# Patient Record
Sex: Male | Born: 1965 | Race: White | Hispanic: No | Marital: Married | Smoking: Former smoker
Health system: Southern US, Community
[De-identification: ages and names within clinical notes are randomized; demographics above are authoritative.]

## PROBLEM LIST (undated history)

## (undated) DIAGNOSIS — S0219XA Other fracture of base of skull, initial encounter for closed fracture: Secondary | ICD-10-CM

## (undated) DIAGNOSIS — R569 Unspecified convulsions: Secondary | ICD-10-CM

## (undated) DIAGNOSIS — N529 Male erectile dysfunction, unspecified: Secondary | ICD-10-CM

## (undated) DIAGNOSIS — M549 Dorsalgia, unspecified: Secondary | ICD-10-CM

## (undated) DIAGNOSIS — S069X9A Unspecified intracranial injury with loss of consciousness of unspecified duration, initial encounter: Secondary | ICD-10-CM

## (undated) DIAGNOSIS — R413 Other amnesia: Secondary | ICD-10-CM

## (undated) DIAGNOSIS — E291 Testicular hypofunction: Secondary | ICD-10-CM

## (undated) DIAGNOSIS — S069XAA Unspecified intracranial injury with loss of consciousness status unknown, initial encounter: Secondary | ICD-10-CM

## (undated) DIAGNOSIS — G4730 Sleep apnea, unspecified: Secondary | ICD-10-CM

## (undated) HISTORY — DX: Dorsalgia, unspecified: M54.9

## (undated) HISTORY — DX: Male erectile dysfunction, unspecified: N52.9

## (undated) HISTORY — DX: Unspecified intracranial injury with loss of consciousness status unknown, initial encounter: S06.9XAA

## (undated) HISTORY — PX: BRAIN SURGERY: SHX531

## (undated) HISTORY — DX: Testicular hypofunction: E29.1

## (undated) HISTORY — DX: Other fracture of base of skull, initial encounter for closed fracture: S02.19XA

## (undated) HISTORY — DX: Unspecified intracranial injury with loss of consciousness of unspecified duration, initial encounter: S06.9X9A

## (undated) HISTORY — DX: Unspecified convulsions: R56.9

## (undated) HISTORY — PX: FRACTURE SURGERY: SHX138

## (undated) HISTORY — PX: OTHER SURGICAL HISTORY: SHX169

---

## 2012-11-03 ENCOUNTER — Other Ambulatory Visit: Payer: Self-pay | Admitting: Family Medicine

## 2012-11-03 DIAGNOSIS — E785 Hyperlipidemia, unspecified: Secondary | ICD-10-CM

## 2012-11-21 ENCOUNTER — Other Ambulatory Visit: Payer: Self-pay | Admitting: Family Medicine

## 2012-11-21 DIAGNOSIS — R7989 Other specified abnormal findings of blood chemistry: Secondary | ICD-10-CM

## 2012-11-21 DIAGNOSIS — E559 Vitamin D deficiency, unspecified: Secondary | ICD-10-CM

## 2012-11-28 ENCOUNTER — Other Ambulatory Visit (INDEPENDENT_AMBULATORY_CARE_PROVIDER_SITE_OTHER): Payer: BC Managed Care – PPO

## 2012-11-28 DIAGNOSIS — E785 Hyperlipidemia, unspecified: Secondary | ICD-10-CM

## 2012-11-28 DIAGNOSIS — R7989 Other specified abnormal findings of blood chemistry: Secondary | ICD-10-CM

## 2012-11-28 DIAGNOSIS — E559 Vitamin D deficiency, unspecified: Secondary | ICD-10-CM

## 2012-11-28 LAB — CBC WITH DIFFERENTIAL/PLATELET
Eosinophils Relative: 3 % (ref 0–5)
HCT: 40.8 % (ref 39.0–52.0)
Hemoglobin: 14.6 g/dL (ref 13.0–17.0)
Lymphocytes Relative: 46 % (ref 12–46)
Lymphs Abs: 1.9 10*3/uL (ref 0.7–4.0)
MCV: 92.1 fL (ref 78.0–100.0)
Monocytes Absolute: 0.4 10*3/uL (ref 0.1–1.0)
Monocytes Relative: 9 % (ref 3–12)
Neutro Abs: 1.7 10*3/uL (ref 1.7–7.7)
WBC: 4.1 10*3/uL (ref 4.0–10.5)

## 2012-11-28 LAB — LIPID PANEL
HDL: 32 mg/dL — ABNORMAL LOW (ref >39)
LDL Cholesterol: 76 mg/dL (ref 0–99)
Total CHOL/HDL Ratio: 4.5 Ratio
Triglycerides: 179 mg/dL — ABNORMAL HIGH (ref <150)
VLDL: 36 mg/dL (ref 0–40)

## 2012-11-28 LAB — TESTOSTERONE: Testosterone: 262 ng/dL — ABNORMAL LOW (ref 300–890)

## 2012-11-28 LAB — BASIC METABOLIC PANEL
CO2: 31 mEq/L (ref 19–32)
Calcium: 8.7 mg/dL (ref 8.4–10.5)
Potassium: 4.4 mEq/L (ref 3.5–5.3)
Sodium: 144 mEq/L (ref 135–145)

## 2012-11-29 LAB — VITAMIN D 25 HYDROXY (VIT D DEFICIENCY, FRACTURES): Vit D, 25-Hydroxy: 39 ng/mL (ref 30–89)

## 2012-12-02 ENCOUNTER — Encounter: Payer: Self-pay | Admitting: Family Medicine

## 2012-12-02 ENCOUNTER — Ambulatory Visit (INDEPENDENT_AMBULATORY_CARE_PROVIDER_SITE_OTHER): Payer: BC Managed Care – PPO | Admitting: Family Medicine

## 2012-12-02 VITALS — BP 128/74 | HR 58 | Temp 97.9°F | Resp 14 | Ht 76.0 in | Wt 314.0 lb

## 2012-12-02 DIAGNOSIS — S0219XA Other fracture of base of skull, initial encounter for closed fracture: Secondary | ICD-10-CM | POA: Insufficient documentation

## 2012-12-02 DIAGNOSIS — E291 Testicular hypofunction: Secondary | ICD-10-CM | POA: Insufficient documentation

## 2012-12-02 DIAGNOSIS — S069X9A Unspecified intracranial injury with loss of consciousness of unspecified duration, initial encounter: Secondary | ICD-10-CM | POA: Insufficient documentation

## 2012-12-02 DIAGNOSIS — N529 Male erectile dysfunction, unspecified: Secondary | ICD-10-CM | POA: Insufficient documentation

## 2012-12-02 DIAGNOSIS — Z0000 Encounter for general adult medical examination without abnormal findings: Secondary | ICD-10-CM
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
## 2013-06-29 ENCOUNTER — Encounter: Payer: Self-pay | Admitting: Physician Assistant

## 2013-06-29 ENCOUNTER — Ambulatory Visit (INDEPENDENT_AMBULATORY_CARE_PROVIDER_SITE_OTHER): Payer: BC Managed Care – PPO | Admitting: Physician Assistant

## 2013-06-29 VITALS — BP 122/80 | HR 68 | Temp 98.4°F | Resp 20 | Wt 319.0 lb

## 2013-06-29 DIAGNOSIS — J988 Other specified respiratory disorders: Secondary | ICD-10-CM

## 2013-06-29 DIAGNOSIS — A499 Bacterial infection, unspecified: Secondary | ICD-10-CM

## 2013-06-29 DIAGNOSIS — B9689 Other specified bacterial agents as the cause of diseases classified elsewhere: Principal | ICD-10-CM

## 2013-06-29 MED ORDER — AZITHROMYCIN 250 MG PO TABS: ORAL_TABLET | ORAL | Status: DC

## 2013-06-29 NOTE — Progress Notes (Signed)
Patient ID: Richard Cardenas MRN: [id number], DOB: May 16, 1966, 48 y.o. Date of Encounter: 06/29/2013, 11:22 AM    Chief Complaint:  Chief Complaint  Patient presents with  . c/o cold, cough,congestion x 1 week    body aches     HPI: 48 y.o. year old white male reports that he has been sick for over one week. A lot of head and nasal congestion and achiness. Just in the last few days is also developing a cough. Has had no significant sore throat just irritation. His thermometer would not work he has not been able to check his temp. subjectively, does not think he has had any high fever. No ear ache.     Home Meds: See attached medication section for any medications that were entered at today's visit. The computer does not put those onto this list.The following list is a list of meds entered prior to today's visit.   Current Outpatient Prescriptions on File Prior to Visit  Medication Sig Dispense Refill  . COD LIVER OIL PO Take by mouth.      . Ginkgo Biloba 40 MG TABS Take by mouth.      . naproxen (NAPROSYN) 500 MG tablet Take 500 mg by mouth 2 (two) times daily with a meal.       No current facility-administered medications on file prior to visit.    Allergies:  Allergies  Allergen Reactions  . Ampicillin Rash      Review of Systems: See HPI for pertinent ROS. All other ROS negative.    Physical Exam: Blood pressure 122/80, pulse 68, temperature 98.4 F (36.9 C), temperature source Oral, resp. rate 20, weight 319 lb (144.697 kg)., Body mass index is 38.85 kg/(m^2). General:  WNWD WM. Appears in no acute distress. HEENT: Normocephalic, atraumatic, eyes without discharge, sclera non-icteric, nares are without discharge. He reports that he is blind in the left eye. Bilateral auditory canals clear, TM's are without perforation, pearly grey and translucent with reflective cone of light bilaterally. Oral cavity moist, posterior pharynx without exudate, erythema, peritonsillar  abscess. No tenderness with percussion of the sinuses.  Neck: Supple. No thyromegaly. No lymphadenopathy. Lungs: Clear bilaterally to auscultation without wheezes, rales, or rhonchi. Breathing is unlabored. Heart: Regular rhythm. No murmurs, rubs, or gallops. Msk:  Strength and tone normal for age. Extremities/Skin: Warm and dry. No clubbing or cyanosis. No edema. No rashes or suspicious lesions. Neuro: Alert and oriented X 3. Moves all extremities spontaneously. Gait is normal. CNII-XII grossly in tact. Psych: History of traumatic brain injury.      ASSESSMENT AND PLAN:  48 y.o. year old male with  1. Bacterial respiratory infection Allergy to ampicillin . - azithromycin (ZITHROMAX) 250 MG tablet; Day 1: Take 2 daily.  Days 2-5: Take 1 daily.  Dispense: 6 tablet; Refill: 0 Complete all of antibiotic. Continue over-the-counter medications for symptomatic relief as well if needed. Followup if symptoms do not resolve within 1 week of completing antibiotic.  Signed, [address] [place], [place], [facility] 06/29/2013 11:22 AM

## 2013-12-04 ENCOUNTER — Encounter: Payer: BC Managed Care – PPO | Admitting: Family Medicine

## 2014-10-31 ENCOUNTER — Encounter (HOSPITAL_COMMUNITY): Payer: Self-pay | Admitting: Emergency Medicine

## 2014-10-31 ENCOUNTER — Emergency Department (HOSPITAL_COMMUNITY): Payer: BLUE CROSS/BLUE SHIELD

## 2014-10-31 ENCOUNTER — Emergency Department (HOSPITAL_COMMUNITY)
Admission: EM | Admit: 2014-10-31 | Discharge: 2014-10-31 | Disposition: A | Discharge status: Home / Self Care | Payer: BLUE CROSS/BLUE SHIELD | Attending: Emergency Medicine | Admitting: Emergency Medicine

## 2014-10-31 DIAGNOSIS — Z79899 Other long term (current) drug therapy: Secondary | ICD-10-CM | POA: Insufficient documentation

## 2014-10-31 DIAGNOSIS — Z8781 Personal history of (healed) traumatic fracture: Secondary | ICD-10-CM | POA: Diagnosis not present

## 2014-10-31 DIAGNOSIS — R569 Unspecified convulsions: Secondary | ICD-10-CM

## 2014-10-31 DIAGNOSIS — Z8639 Personal history of other endocrine, nutritional and metabolic disease: Secondary | ICD-10-CM | POA: Diagnosis not present

## 2014-10-31 DIAGNOSIS — Z792 Long term (current) use of antibiotics: Secondary | ICD-10-CM | POA: Insufficient documentation

## 2014-10-31 DIAGNOSIS — R413 Other amnesia: Secondary | ICD-10-CM | POA: Diagnosis not present

## 2014-10-31 DIAGNOSIS — Z87438 Personal history of other diseases of male genital organs: Secondary | ICD-10-CM | POA: Insufficient documentation

## 2014-10-31 DIAGNOSIS — Z791 Long term (current) use of non-steroidal anti-inflammatories (NSAID): Secondary | ICD-10-CM | POA: Insufficient documentation

## 2014-10-31 DIAGNOSIS — Z87820 Personal history of traumatic brain injury: Secondary | ICD-10-CM | POA: Diagnosis not present

## 2014-10-31 LAB — CBC WITH DIFFERENTIAL/PLATELET
Basophils Absolute: 0 10*3/uL (ref 0.0–0.1)
Basophils Relative: 0 % (ref 0–1)
EOS PCT: 1 % (ref 0–5)
Eosinophils Absolute: 0 10*3/uL (ref 0.0–0.7)
HEMATOCRIT: 41.5 % (ref 39.0–52.0)
Hemoglobin: 15 g/dL (ref 13.0–17.0)
LYMPHS ABS: 1 10*3/uL (ref 0.7–4.0)
LYMPHS PCT: 16 % (ref 12–46)
MCH: 32.6 pg (ref 26.0–34.0)
MCHC: 36.1 g/dL — ABNORMAL HIGH (ref 30.0–36.0)
MCV: 90.2 fL (ref 78.0–100.0)
MONO ABS: 0.4 10*3/uL (ref 0.1–1.0)
MONOS PCT: 7 % (ref 3–12)
Neutro Abs: 4.6 10*3/uL (ref 1.7–7.7)
Neutrophils Relative %: 76 % (ref 43–77)
PLATELETS: 176 10*3/uL (ref 150–400)
RBC: 4.6 MIL/uL (ref 4.22–5.81)
RDW: 11.9 % (ref 11.5–15.5)
WBC: 6.1 10*3/uL (ref 4.0–10.5)

## 2014-10-31 LAB — MAGNESIUM: Magnesium: 2.2 mg/dL (ref 1.7–2.4)

## 2014-10-31 LAB — URINALYSIS, ROUTINE W REFLEX MICROSCOPIC
Bilirubin Urine: NEGATIVE
Glucose, UA: NEGATIVE mg/dL
Hgb urine dipstick: NEGATIVE
Ketones, ur: NEGATIVE mg/dL
LEUKOCYTES UA: NEGATIVE
NITRITE: NEGATIVE
Protein, ur: NEGATIVE mg/dL
SPECIFIC GRAVITY, URINE: 1.024 (ref 1.005–1.030)
UROBILINOGEN UA: 0.2 mg/dL (ref 0.0–1.0)
pH: 5.5 (ref 5.0–8.0)

## 2014-10-31 LAB — BASIC METABOLIC PANEL
Anion gap: 10 (ref 5–15)
BUN: 22 mg/dL — ABNORMAL HIGH (ref 6–20)
CALCIUM: 8.8 mg/dL — AB (ref 8.9–10.3)
CO2: 24 mmol/L (ref 22–32)
CREATININE: 0.93 mg/dL (ref 0.61–1.24)
Chloride: 102 mmol/L (ref 101–111)
GFR calc Af Amer: >60 mL/min (ref >60)
Glucose, Bld: 115 mg/dL — ABNORMAL HIGH (ref 70–99)
Potassium: 3.8 mmol/L (ref 3.5–5.1)
Sodium: 136 mmol/L (ref 135–145)

## 2014-10-31 LAB — PHOSPHORUS: Phosphorus: 2.1 mg/dL — ABNORMAL LOW (ref 2.5–4.6)

## 2014-10-31 MED ORDER — SODIUM CHLORIDE 0.9 % IV SOLN
1000.0000 mg | Freq: Once | INTRAVENOUS | Status: AC
  Administered 2014-10-31: 1000 mg via INTRAVENOUS
  Filled 2014-10-31: qty 10

## 2014-10-31 MED ORDER — LEVETIRACETAM 500 MG PO TABS: 500.0000 mg | ORAL_TABLET | Freq: Two times a day (BID) | ORAL | Status: DC

## 2014-10-31 NOTE — ED Notes (Signed)
Neurology at bedside.

## 2014-10-31 NOTE — ED Notes (Signed)
EDP at bedside  
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
## 2014-10-31 NOTE — Discharge Instructions (Signed)

## 2014-10-31 NOTE — ED Notes (Signed)
Pt was unable to provide sample at this time
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
## 2014-11-04 ENCOUNTER — Encounter: Payer: Self-pay | Admitting: Neurology

## 2014-11-04 ENCOUNTER — Ambulatory Visit (INDEPENDENT_AMBULATORY_CARE_PROVIDER_SITE_OTHER): Payer: BLUE CROSS/BLUE SHIELD | Admitting: Neurology

## 2014-11-04 VITALS — BP 146/91 | HR 68 | Ht 76.0 in | Wt 322.0 lb

## 2014-11-04 DIAGNOSIS — R569 Unspecified convulsions: Secondary | ICD-10-CM | POA: Diagnosis not present

## 2014-11-04 MED ORDER — LEVETIRACETAM 1000 MG PO TABS: 1000.0000 mg | ORAL_TABLET | Freq: Two times a day (BID) | ORAL | Status: DC

## 2014-11-04 NOTE — Patient Instructions (Signed)
No drive till seizure free for 6 months, no mechanical operations
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
## 2014-11-08 ENCOUNTER — Other Ambulatory Visit: Payer: BLUE CROSS/BLUE SHIELD

## 2014-11-11 ENCOUNTER — Ambulatory Visit (INDEPENDENT_AMBULATORY_CARE_PROVIDER_SITE_OTHER): Payer: BLUE CROSS/BLUE SHIELD | Admitting: *Deleted

## 2014-11-11 DIAGNOSIS — Z23 Encounter for immunization: Secondary | ICD-10-CM | POA: Diagnosis not present

## 2014-11-11 NOTE — Progress Notes (Signed)
Patient ID: Richard Cardenas, male   DOB: 04/28/1966, 49 y.o.   MRN: [id number] Patient seen in office for Tdap Vaccination.   Tolerated IM administration well.   Immunization history updated.

## 2014-11-23 ENCOUNTER — Ambulatory Visit
Admission: RE | Admit: 2014-11-23 | Discharge: 2014-11-23 | Disposition: A | Discharge status: Home / Self Care | Payer: BLUE CROSS/BLUE SHIELD | Source: Ambulatory Visit | Attending: Neurology | Admitting: Neurology

## 2014-11-23 DIAGNOSIS — R569 Unspecified convulsions: Secondary | ICD-10-CM | POA: Diagnosis not present

## 2014-11-23 MED ORDER — GADOBENATE DIMEGLUMINE 529 MG/ML IV SOLN
20.0000 mL | Freq: Once | INTRAVENOUS | Status: AC | PRN
  Administered 2014-11-23: 20 mL via INTRAVENOUS

## 2014-11-25 ENCOUNTER — Telehealth: Payer: Self-pay | Admitting: Neurology

## 2014-11-25 ENCOUNTER — Ambulatory Visit (INDEPENDENT_AMBULATORY_CARE_PROVIDER_SITE_OTHER): Payer: BLUE CROSS/BLUE SHIELD | Admitting: Neurology

## 2014-11-25 DIAGNOSIS — R569 Unspecified convulsions: Secondary | ICD-10-CM | POA: Diagnosis not present

## 2014-11-25 NOTE — Telephone Encounter (Signed)
Richard Cardenas Please call patient, MRI of the brain showed scars at left side of the brain, and the previous left side brain surgery. Consistent with history medical brain injury.   Abnormal MRI brain (with and without) demonstrating: 1. Encephalomalacia and gliosis in the left frontal, left temporal, left parietal regions. Left fronto-temporal craniotomy and cranioplasty. Consistent with prior traumatic brain injury. 2. No acute findings.

## 2014-11-25 NOTE — Telephone Encounter (Signed)
Patient aware of results.

## 2014-11-26 NOTE — Procedures (Signed)
   HISTORY: 49 yo male with history of traumatic brain injury, left frontotemporal craniotomy, presented with seizure.  TECHNIQUE:  16 channel EEG was performed based on standard 10-16 international system. One channel was dedicated to EKG, which has demonstrates normal sinus rhythm of 60 beats per minutes.  Upon awakening, the posterior background activity was mildly dysarrythmic, right side is in mixed apha and theta range, left side has higher amplitude, within theta range, especially at F7, T3, T5 leads,  reactive to eye opening and closure.  There was occasionally F7 sharp transient  Photic stimulation was performed, which induced a symmetric photic driving.  Hyperventilation was performed, there was continued evidence of left side mild slowing dysarrythmic activities.  No sleep was achieved.  CONCLUSION: This is an abnormal EEG.  There is  electrodiagnostic evidence of left frontotemporal slowing, irritability, consistent with history of brain injury. He is at increased risk of partial seizure.

## 2014-11-30 ENCOUNTER — Encounter: Payer: Self-pay | Admitting: Neurology

## 2014-11-30 NOTE — Telephone Encounter (Signed)
He is calling for EEG results.

## 2014-12-02 ENCOUNTER — Telehealth: Payer: Self-pay | Admitting: Neurology

## 2014-12-02 NOTE — Telephone Encounter (Signed)
Spoke to patient's wife, Deliah Goody (on HIPPA) - she is aware of the results - they will keep the follow up appt on 12/08/14 to further discuss.

## 2014-12-02 NOTE — Telephone Encounter (Signed)
Richard Cardenas, please call patient  This is an abnormal EEG. There is electrodiagnostic evidence of left frontotemporal slowing, irritability, consistent with history of brain injury. He is at increased risk of partial seizure.

## 2014-12-08 ENCOUNTER — Encounter: Payer: Self-pay | Admitting: Neurology

## 2014-12-08 ENCOUNTER — Ambulatory Visit (INDEPENDENT_AMBULATORY_CARE_PROVIDER_SITE_OTHER): Payer: BLUE CROSS/BLUE SHIELD | Admitting: Neurology

## 2014-12-08 VITALS — BP 147/92 | HR 78 | Ht 76.0 in | Wt 322.0 lb

## 2014-12-08 DIAGNOSIS — S069X5S Unspecified intracranial injury with loss of consciousness greater than 24 hours with return to pre-existing conscious level, sequela: Secondary | ICD-10-CM

## 2014-12-08 DIAGNOSIS — R569 Unspecified convulsions: Secondary | ICD-10-CM
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
## 2014-12-21 ENCOUNTER — Telehealth: Payer: Self-pay | Admitting: Family Medicine

## 2014-12-21 NOTE — Telephone Encounter (Signed)
Patient calling to discuss a note for jury duty  Please call him at [phone]

## 2014-12-21 NOTE — Telephone Encounter (Signed)
Pt has not been seen since 2014 and he is aware that you are out of town until Tuesday - Madaline Savage Duty is not until August. Pt request we call his wife at [phone].

## 2014-12-28 ENCOUNTER — Encounter: Payer: Self-pay | Admitting: Family Medicine

## 2014-12-28 NOTE — Telephone Encounter (Signed)
On my desk

## 2014-12-28 NOTE — Telephone Encounter (Signed)
pts wife aware letter is ready and will pick it up - letter placed up front

## 2015-04-04 ENCOUNTER — Encounter: Payer: Self-pay | Admitting: Family Medicine

## 2015-04-07 ENCOUNTER — Other Ambulatory Visit: Payer: Self-pay | Admitting: Family Medicine

## 2015-04-07 ENCOUNTER — Other Ambulatory Visit: Payer: BLUE CROSS/BLUE SHIELD

## 2015-04-07 ENCOUNTER — Ambulatory Visit: Payer: BLUE CROSS/BLUE SHIELD

## 2015-04-07 DIAGNOSIS — Z139 Encounter for screening, unspecified: Secondary | ICD-10-CM

## 2015-04-08 ENCOUNTER — Encounter: Payer: Self-pay | Admitting: Family Medicine

## 2015-04-08 LAB — HEPATITIS B CORE ANTIBODY, IGM: Hep B C IgM: NONREACTIVE

## 2015-04-08 LAB — HEPATITIS C ANTIBODY: HCV Ab: NEGATIVE

## 2015-06-07 ENCOUNTER — Ambulatory Visit (INDEPENDENT_AMBULATORY_CARE_PROVIDER_SITE_OTHER): Payer: BLUE CROSS/BLUE SHIELD | Admitting: Nurse Practitioner

## 2015-06-07 ENCOUNTER — Encounter: Payer: Self-pay | Admitting: Nurse Practitioner

## 2015-06-07 VITALS — BP 132/89 | HR 58 | Ht 76.0 in | Wt 340.4 lb

## 2015-06-07 DIAGNOSIS — G40909 Epilepsy, unspecified, not intractable, without status epilepticus: Secondary | ICD-10-CM | POA: Insufficient documentation

## 2015-06-07 DIAGNOSIS — Z87820 Personal history of traumatic brain injury: Secondary | ICD-10-CM | POA: Diagnosis not present

## 2015-06-07 NOTE — Progress Notes (Signed)
I have reviewed and agreed above plan. 

## 2015-06-07 NOTE — Patient Instructions (Signed)
Continue Keppra at current dose Call for seizure activity May resume driving F/U in 6 months
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
## 2015-06-10 ENCOUNTER — Encounter: Payer: Self-pay | Admitting: Nurse Practitioner

## 2015-06-10 ENCOUNTER — Encounter: Payer: Self-pay | Admitting: *Deleted

## 2015-07-15 ENCOUNTER — Telehealth: Payer: Self-pay | Admitting: Family Medicine
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
## 2015-07-19 ENCOUNTER — Other Ambulatory Visit: Payer: Self-pay | Admitting: Specialist

## 2015-07-19 DIAGNOSIS — M545 Low back pain: Secondary | ICD-10-CM

## 2015-07-21 ENCOUNTER — Ambulatory Visit
Admission: RE | Admit: 2015-07-21 | Discharge: 2015-07-21 | Disposition: A | Discharge status: Home / Self Care | Payer: Worker's Compensation | Source: Ambulatory Visit | Attending: Specialist | Admitting: Specialist

## 2015-07-21 DIAGNOSIS — M545 Low back pain: Secondary | ICD-10-CM

## 2015-07-21 NOTE — Telephone Encounter (Signed)
I called and spoke with Richard Cardenas at [facility] she is going to resubmit with the code below.

## 2015-10-29 ENCOUNTER — Other Ambulatory Visit: Payer: Self-pay | Admitting: Neurology

## 2015-10-31 ENCOUNTER — Telehealth: Payer: Self-pay | Admitting: Neurology

## 2015-10-31 NOTE — Telephone Encounter (Signed)
Pt's wife called in requesting a refill on levETIRAcetam (KEPPRA) 1000 MG tablet. Next appt is 11/26/15. Pt will be out of medication today.  [facility] [place],  - [address]

## 2015-10-31 NOTE — Telephone Encounter (Signed)
Rx sent to the pharmacy.

## 2015-11-03 ENCOUNTER — Ambulatory Visit: Payer: Self-pay | Admitting: Podiatry

## 2015-11-16 ENCOUNTER — Encounter: Payer: Self-pay | Admitting: Podiatry

## 2015-11-16 ENCOUNTER — Ambulatory Visit (INDEPENDENT_AMBULATORY_CARE_PROVIDER_SITE_OTHER): Payer: BLUE CROSS/BLUE SHIELD

## 2015-11-16 ENCOUNTER — Ambulatory Visit (INDEPENDENT_AMBULATORY_CARE_PROVIDER_SITE_OTHER): Payer: BLUE CROSS/BLUE SHIELD | Admitting: Podiatry

## 2015-11-16 VITALS — BP 138/83 | HR 72 | Resp 16 | Ht 76.0 in | Wt 340.0 lb

## 2015-11-16 DIAGNOSIS — M79672 Pain in left foot: Secondary | ICD-10-CM | POA: Diagnosis not present

## 2015-11-16 DIAGNOSIS — M722 Plantar fascial fibromatosis: Secondary | ICD-10-CM

## 2015-11-16 DIAGNOSIS — M79671 Pain in right foot: Secondary | ICD-10-CM

## 2015-11-16 MED ORDER — TRIAMCINOLONE ACETONIDE 10 MG/ML IJ SUSP
10.0000 mg | Freq: Once | INTRAMUSCULAR | Status: AC
  Administered 2015-11-16: 10 mg

## 2015-11-16 NOTE — Patient Instructions (Signed)
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
## 2015-11-16 NOTE — Progress Notes (Signed)
Subjective:    Patient ID: Richard Cardenas, male    DOB: [birth year], 50 y.o.   MRN: [id number]  HPI Chief Complaint  Patient presents with  . Foot Pain    Bilateral; on Right foot; plantar forefoot & arch; on Left foot; heel; x6-8 months      Review of Systems  Musculoskeletal: Positive for myalgias.  Neurological: Positive for seizures.  All other systems reviewed and are negative.      Objective:   Physical Exam        Assessment & Plan:

## 2015-12-06 ENCOUNTER — Ambulatory Visit: Payer: BLUE CROSS/BLUE SHIELD | Admitting: Nurse Practitioner

## 2015-12-14 ENCOUNTER — Encounter: Payer: Self-pay | Admitting: Nurse Practitioner

## 2015-12-14 ENCOUNTER — Ambulatory Visit (INDEPENDENT_AMBULATORY_CARE_PROVIDER_SITE_OTHER): Payer: BLUE CROSS/BLUE SHIELD | Admitting: Nurse Practitioner

## 2015-12-14 VITALS — BP 124/73 | HR 72 | Ht 76.0 in | Wt 339.4 lb

## 2015-12-14 DIAGNOSIS — G40909 Epilepsy, unspecified, not intractable, without status epilepticus: Secondary | ICD-10-CM

## 2015-12-14 DIAGNOSIS — Z87820 Personal history of traumatic brain injury: Secondary | ICD-10-CM | POA: Diagnosis not present

## 2015-12-14 MED ORDER — LEVETIRACETAM 1000 MG PO TABS: 1000.0000 mg | ORAL_TABLET | Freq: Two times a day (BID) | ORAL | Status: DC
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
## 2015-12-14 NOTE — Patient Instructions (Signed)
Continue Keppra at current dose will refill  Call for seizure activity F/U in 1 year

## 2015-12-15 NOTE — Progress Notes (Signed)
I have reviewed and agreed above plan. 

## 2016-01-04 ENCOUNTER — Ambulatory Visit: Payer: BLUE CROSS/BLUE SHIELD | Admitting: Podiatry

## 2016-02-24 ENCOUNTER — Encounter: Payer: Self-pay | Admitting: Podiatry

## 2016-03-07 ENCOUNTER — Encounter: Payer: Self-pay | Admitting: Podiatry

## 2016-03-07 ENCOUNTER — Ambulatory Visit (INDEPENDENT_AMBULATORY_CARE_PROVIDER_SITE_OTHER): Payer: BLUE CROSS/BLUE SHIELD | Admitting: Podiatry

## 2016-03-07 DIAGNOSIS — M722 Plantar fascial fibromatosis: Secondary | ICD-10-CM

## 2016-03-07 NOTE — Patient Instructions (Signed)

## 2016-03-07 NOTE — Progress Notes (Signed)
Subjective:     Patient ID: Richard Cardenas, male   DOB: [birth year], 50 y.o.   MRN: [id number]  HPI patient states the left heel remained sore in the medial band and the right one seems to be some better and they have tried stretching but have not done a lot of ice   Review of Systems     Objective:   Physical Exam Neurovascular status intact with patient found to have inflammation in the plantar heel left still present medial band with exquisite discomfort when palpated    Assessment:     Plantar fasciitis left heel    Plan:     Reinjected the plantar fascia 3 Milligan Kenalog 5 mill grams Xylocaine discussed physical therapy dispensed night splint with instructions on usage start orthotics and reappoint to recheck

## 2016-04-04 ENCOUNTER — Encounter: Payer: Self-pay | Admitting: Podiatry

## 2016-04-04 ENCOUNTER — Ambulatory Visit (INDEPENDENT_AMBULATORY_CARE_PROVIDER_SITE_OTHER): Payer: BLUE CROSS/BLUE SHIELD | Admitting: Podiatry

## 2016-04-04 DIAGNOSIS — M722 Plantar fascial fibromatosis: Secondary | ICD-10-CM | POA: Diagnosis not present

## 2016-04-04 MED ORDER — MELOXICAM 15 MG PO TABS: 15.0000 mg | ORAL_TABLET | Freq: Every day | ORAL | 2 refills | Status: DC

## 2016-04-04 MED ORDER — TRIAMCINOLONE ACETONIDE 10 MG/ML IJ SUSP
10.0000 mg | Freq: Once | INTRAMUSCULAR | Status: AC
  Administered 2016-04-04: 10 mg

## 2016-04-04 NOTE — Progress Notes (Signed)
Subjective:     Patient ID: Richard Cardenas, male   DOB: [birth year], 50 y.o.   MRN: [id number]  HPI patient states my heel is improving but it's still sore when I do deep walking or standing   Review of Systems     Objective:   Physical Exam Neurovascular status intact with continued discomfort left plantar fashion with improvement but pain still present upon palpation    Assessment:     Fasciitis left still present but improved    Plan:     Final injection administered 3 Milligan Kenalog 5 mill grams Xylocaine discussed importance of night splint orthotics and supportive shoe gear

## 2016-05-31 ENCOUNTER — Encounter: Payer: Self-pay | Admitting: Physician Assistant

## 2016-05-31 ENCOUNTER — Ambulatory Visit (INDEPENDENT_AMBULATORY_CARE_PROVIDER_SITE_OTHER): Payer: BLUE CROSS/BLUE SHIELD | Admitting: Physician Assistant

## 2016-05-31 VITALS — BP 122/74 | HR 66 | Temp 98.0°F | Resp 18 | Wt 344.0 lb

## 2016-05-31 DIAGNOSIS — J988 Other specified respiratory disorders: Secondary | ICD-10-CM

## 2016-05-31 DIAGNOSIS — B9689 Other specified bacterial agents as the cause of diseases classified elsewhere: Principal | ICD-10-CM

## 2016-05-31 MED ORDER — AZITHROMYCIN 250 MG PO TABS: ORAL_TABLET | ORAL | 0 refills | Status: DC

## 2016-05-31 NOTE — Progress Notes (Signed)
Patient ID: Richard Cardenas MRN: [id number], DOB: 09-09-1965, Richard y.o. Date of Encounter: 05/31/2016, 10:51 AM    Chief Complaint:  Chief Complaint  Patient presents with  . nasal congested  . Headache     HPI: Richard y.o. year old male reasons with above. Says he's been having a lot of congestion in his head and nose as well as his chest. Is coughing up some phlegm at times. Says that his wife has the same type of symptoms and recently had to get antibiotic. He has had no sore throat or ear ache and no fevers or chills.     Home Meds:   Outpatient Medications Prior to Visit  Medication Sig Dispense Refill  . aspirin-acetaminophen-caffeine (EXCEDRIN MIGRAINE) 250-250-65 MG tablet Take by mouth every 6 (six) hours as needed for headache.    . cetirizine (ZYRTEC) 10 MG tablet Take 10 mg by mouth daily.    . cyclobenzaprine (FLEXERIL) 10 MG tablet Take 10 mg by mouth as needed for muscle spasms.    . hydroxypropyl methylcellulose / hypromellose (ISOPTO TEARS / GONIOVISC) 2.5 % ophthalmic solution Place 1 drop into both eyes 2 (two) times daily.    Marland Kitchen levETIRAcetam (KEPPRA) 1000 MG tablet Take 1 tablet (1,000 mg total) by mouth 2 (two) times daily. 180 tablet 3  . meloxicam (MOBIC) 15 MG tablet Take 1 tablet (15 mg total) by mouth daily. 30 tablet 2  . Multiple Vitamin (MULTIVITAMIN) tablet Take 1 tablet by mouth daily.    . Pyridoxine HCl (VITAMIN B-6) 500 MG tablet Take 500 mg by mouth daily.    . vitamin C (ASCORBIC ACID) 500 MG tablet Take 500 mg by mouth daily.     No facility-administered medications prior to visit.     Allergies:  Allergies  Allergen Reactions  . Ampicillin Rash      Review of Systems: See HPI for pertinent ROS. All other ROS negative.    Physical Exam: Blood pressure 122/74, pulse 66, temperature 98 F (36.7 C), temperature source Oral, resp. rate 18, weight (!) 344 lb (156 kg), SpO2 98 %., Body mass index is 41.87 kg/m. General:  WM. Appears in no  acute distress. HEENT: Normocephalic, atraumatic, eyes without discharge, sclera non-icteric, nares are without discharge. Bilateral auditory canals clear, TM's are without perforation, pearly grey and translucent with reflective cone of light bilaterally. Oral cavity moist, posterior pharynx without exudate, erythema, peritonsillar abscess. No tenderness with percussion to frontal or maxillary sinuses bilaterally.  Neck: Supple. No thyromegaly. No lymphadenopathy. Lungs: Clear bilaterally to auscultation without wheezes, rales, or rhonchi. Breathing is unlabored. Heart: Regular rhythm. No murmurs, rubs, or gallops. Msk:  Strength and tone normal for age. Extremities/Skin: Warm and dry.  Neuro: Alert and oriented X 3. Moves all extremities spontaneously. Gait is normal. CNII-XII grossly in tact. Psych:  Responds to questions appropriately with a normal affect.     ASSESSMENT AND PLAN:  Richard y.o. year old male with  1. Bacterial respiratory infection Has ampicillin allergy documented. Therefore will use azithromycin. To take antibiotic as directed. Follow-up if symptoms do not resolve within 1 week after completion of antibiotic. - azithromycin (ZITHROMAX) 250 MG tablet; Day 1: Take 2 daily. Days 2-5: Take 1 daily.  Dispense: 6 tablet; Refill: 0   Signed, [address] [place], [place], [facility] 05/31/2016 10:51 AM

## 2016-06-27 ENCOUNTER — Telehealth: Payer: Self-pay | Admitting: Family Medicine

## 2016-06-27 MED ORDER — LEVOFLOXACIN 750 MG PO TABS: 750.0000 mg | ORAL_TABLET | Freq: Every day | ORAL | 0 refills | Status: DC

## 2016-06-27 NOTE — Telephone Encounter (Signed)
Still sick from December.  Off work today.  Sneezing.  Cough,congestion.  OTC's not helping.  Works outside a lot.  No fever.  What else can we do?

## 2016-06-27 NOTE — Telephone Encounter (Signed)
Ampicillin allergy.  At OV prescribed azithromycin. At this time--- prescribe Levaquin 750 mg 1 by mouth daily times #7 dispense #7+0.

## 2016-06-27 NOTE — Telephone Encounter (Signed)
Pt made aware of Rx.  If still not better after this will NTBS

## 2016-07-03 ENCOUNTER — Other Ambulatory Visit: Payer: Self-pay | Admitting: Physician Assistant

## 2016-07-26 ENCOUNTER — Other Ambulatory Visit: Payer: Self-pay | Admitting: Podiatry

## 2016-07-26 NOTE — Telephone Encounter (Signed)
Pt needs an appt prior to future refills. 

## 2016-08-16 ENCOUNTER — Other Ambulatory Visit: Payer: Self-pay | Admitting: Family Medicine

## 2016-08-16 DIAGNOSIS — E291 Testicular hypofunction: Secondary | ICD-10-CM

## 2016-08-16 DIAGNOSIS — Z0000 Encounter for general adult medical examination without abnormal findings: Secondary | ICD-10-CM

## 2016-08-16 DIAGNOSIS — G40909 Epilepsy, unspecified, not intractable, without status epilepticus: Secondary | ICD-10-CM

## 2016-08-16 DIAGNOSIS — Z79899 Other long term (current) drug therapy: Secondary | ICD-10-CM

## 2016-08-20 ENCOUNTER — Other Ambulatory Visit: Payer: BLUE CROSS/BLUE SHIELD

## 2016-08-20 DIAGNOSIS — G40909 Epilepsy, unspecified, not intractable, without status epilepticus: Secondary | ICD-10-CM

## 2016-08-20 DIAGNOSIS — Z79899 Other long term (current) drug therapy: Secondary | ICD-10-CM

## 2016-08-20 DIAGNOSIS — E291 Testicular hypofunction: Secondary | ICD-10-CM

## 2016-08-20 DIAGNOSIS — Z0000 Encounter for general adult medical examination without abnormal findings: Secondary | ICD-10-CM

## 2016-08-20 LAB — PSA: PSA: 0.5 ng/mL (ref <=4.0)

## 2016-08-20 LAB — COMPLETE METABOLIC PANEL WITH GFR
ALK PHOS: 28 U/L — AB (ref 40–115)
ALT: 18 U/L (ref 9–46)
AST: 19 U/L (ref 10–35)
Albumin: 3.8 g/dL (ref 3.6–5.1)
BUN: 18 mg/dL (ref 7–25)
CO2: 30 mmol/L (ref 20–31)
Calcium: 8.9 mg/dL (ref 8.6–10.3)
Chloride: 107 mmol/L (ref 98–110)
Creat: 0.93 mg/dL (ref 0.70–1.33)
GFR, Est African American: >89 mL/min (ref >=60)
GFR, Est Non African American: >89 mL/min (ref >=60)
GLUCOSE: 91 mg/dL (ref 70–99)
Potassium: 4.6 mmol/L (ref 3.5–5.3)
SODIUM: 143 mmol/L (ref 135–146)
TOTAL PROTEIN: 6.2 g/dL (ref 6.1–8.1)
Total Bilirubin: 0.5 mg/dL (ref 0.2–1.2)

## 2016-08-20 LAB — CBC WITH DIFFERENTIAL/PLATELET
BASOS PCT: 1 %
Basophils Absolute: 44 cells/uL (ref 0–200)
EOS ABS: 88 {cells}/uL (ref 15–500)
Eosinophils Relative: 2 %
HEMATOCRIT: 41.9 % (ref 38.5–50.0)
HEMOGLOBIN: 14.4 g/dL (ref 13.0–17.0)
LYMPHS ABS: 1804 {cells}/uL (ref 850–3900)
LYMPHS PCT: 41 %
MCH: 32.7 pg (ref 27.0–33.0)
MCHC: 34.4 g/dL (ref 32.0–36.0)
MCV: 95.2 fL (ref 80.0–100.0)
MONO ABS: 352 {cells}/uL (ref 200–950)
MPV: 10.5 fL (ref 7.5–12.5)
Monocytes Relative: 8 %
Neutro Abs: 2112 cells/uL (ref 1500–7800)
Neutrophils Relative %: 48 %
Platelets: 176 10*3/uL (ref 140–400)
RBC: 4.4 MIL/uL (ref 4.20–5.80)
RDW: 13.3 % (ref 11.0–15.0)
WBC: 4.4 10*3/uL (ref 3.8–10.8)

## 2016-08-20 LAB — LIPID PANEL
Cholesterol: 144 mg/dL (ref <200)
HDL: 30 mg/dL — AB (ref >40)
LDL CALC: 70 mg/dL (ref <100)
Total CHOL/HDL Ratio: 4.8 Ratio (ref <5.0)
Triglycerides: 220 mg/dL — ABNORMAL HIGH (ref <150)
VLDL: 44 mg/dL — AB (ref <30)

## 2016-08-20 LAB — TSH: TSH: 1.32 mIU/L (ref 0.40–4.50)

## 2016-08-23 ENCOUNTER — Encounter: Payer: Self-pay | Admitting: Physician Assistant

## 2016-08-23 ENCOUNTER — Ambulatory Visit (INDEPENDENT_AMBULATORY_CARE_PROVIDER_SITE_OTHER): Payer: BLUE CROSS/BLUE SHIELD | Admitting: Physician Assistant

## 2016-08-23 VITALS — BP 118/68 | HR 68 | Temp 97.8°F | Resp 18 | Wt 345.6 lb

## 2016-08-23 DIAGNOSIS — Z0000 Encounter for general adult medical examination without abnormal findings: Secondary | ICD-10-CM
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
## 2016-08-28 ENCOUNTER — Other Ambulatory Visit: Payer: Self-pay | Admitting: Podiatry

## 2016-09-18 ENCOUNTER — Telehealth: Payer: Self-pay | Admitting: Family Medicine

## 2016-09-18 NOTE — Telephone Encounter (Signed)
patient dropped off a handicap placard reqeust. I have put in Sandy's folder.

## 2016-09-20 NOTE — Telephone Encounter (Signed)
Contact phone number incorrect in this note. Called number listed in demo and pt aware to pu completed form.

## 2016-09-25 ENCOUNTER — Encounter: Payer: Self-pay | Admitting: Physician Assistant

## 2016-10-01 ENCOUNTER — Encounter: Payer: Self-pay | Admitting: Family Medicine
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
## 2016-11-19 LAB — HM COLONOSCOPY

## 2016-12-19 ENCOUNTER — Ambulatory Visit (INDEPENDENT_AMBULATORY_CARE_PROVIDER_SITE_OTHER): Payer: BLUE CROSS/BLUE SHIELD | Admitting: Nurse Practitioner

## 2016-12-19 ENCOUNTER — Encounter: Payer: Self-pay | Admitting: Physician Assistant

## 2016-12-19 ENCOUNTER — Encounter: Payer: Self-pay | Admitting: Nurse Practitioner

## 2016-12-19 VITALS — BP 129/82 | HR 70 | Ht 76.0 in | Wt 345.8 lb

## 2016-12-19 DIAGNOSIS — Z87820 Personal history of traumatic brain injury: Secondary | ICD-10-CM

## 2016-12-19 DIAGNOSIS — G40909 Epilepsy, unspecified, not intractable, without status epilepticus: Secondary | ICD-10-CM | POA: Diagnosis not present

## 2016-12-19 MED ORDER — LEVETIRACETAM 1000 MG PO TABS: 1000.0000 mg | ORAL_TABLET | Freq: Two times a day (BID) | ORAL | 3 refills | Status: DC
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
## 2016-12-19 NOTE — Patient Instructions (Addendum)
Continue Keppra at current dose will refill  Call for seizure activity Encouraged to increase fluid intake since he works outside and is prone to get dehydrated F/U in 1 year  Dehydration, Adult Dehydration is when there is not enough fluid or water in your body. This happens when you lose more fluids than you take in. Dehydration can range from mild to very bad. It should be treated right away to keep it from getting very bad. Symptoms of mild dehydration may include:  Thirst.  Dry lips.  Slightly dry mouth.  Dry, warm skin.  Dizziness. Symptoms of moderate dehydration may include:  Very dry mouth.  Muscle cramps.  Dark pee (urine). Pee may be the color of tea.  Your body making less pee.  Your eyes making fewer tears.  Heartbeat that is uneven or faster than normal (palpitations).  Headache.  Light-headedness, especially when you stand up from sitting.  Fainting (syncope). Symptoms of very bad dehydration may include:  Changes in skin, such as: ? Cold and clammy skin. ? Blotchy (mottled) or pale skin. ? Skin that does not quickly return to normal after being lightly pinched and let go (poor skin turgor).  Changes in body fluids, such as: ? Feeling very thirsty. ? Your eyes making fewer tears. ? Not sweating when body temperature is high, such as in hot weather. ? Your body making very little pee.  Changes in vital signs, such as: ? Weak pulse. ? Pulse that is more than 100 beats a minute when you are sitting still. ? Fast breathing. ? Low blood pressure.  Other changes, such as: ? Sunken eyes. ? Cold hands and feet. ? Confusion. ? Lack of energy (lethargy). ? Trouble waking up from sleep. ? Short-term weight loss. ? Unconsciousness. Follow these instructions at home:  If told by your doctor, drink an ORS: ? Make an ORS by using instructions on the package. ? Start by drinking small amounts, about  cup (120 mL) every 5-10 minutes. ? Slowly drink  more until you have had the amount that your doctor said to have.  Drink enough clear fluid to keep your pee clear or pale yellow. If you were told to drink an ORS, finish the ORS first, then start slowly drinking clear fluids. Drink fluids such as: ? Water. Do not drink only water by itself. Doing that can make the salt (sodium) level in your body get too low (hyponatremia). ? Ice chips. ? Fruit juice that you have added water to (diluted). ? Low-calorie sports drinks.  Avoid: ? Alcohol. ? Drinks that have a lot of sugar. These include high-calorie sports drinks, fruit juice that does not have water added, and soda. ? Caffeine. ? Foods that are greasy or have a lot of fat or sugar.  Take over-the-counter and prescription medicines only as told by your doctor.  Do not take salt tablets. Doing that can make the salt level in your body get too high (hypernatremia).  Eat foods that have minerals (electrolytes). Examples include bananas, oranges, potatoes, tomatoes, and spinach.  Keep all follow-up visits as told by your doctor. This is important. Contact a doctor if:  You have belly (abdominal) pain that: ? Gets worse. ? Stays in one area (localizes).  You have a rash.  You have a stiff neck.  You get angry or annoyed more easily than normal (irritability).  You are more sleepy than normal.  You have a harder time waking up than normal.  You feel: ?  Weak. ? Dizzy. ? Very thirsty.  You have peed (urinated) only a small amount of very dark pee during 6-8 hours. Get help right away if:  You have symptoms of very bad dehydration.  You cannot drink fluids without throwing up (vomiting).  Your symptoms get worse with treatment.  You have a fever.  You have a very bad headache.  You are throwing up or having watery poop (diarrhea) and it: ? Gets worse. ? Does not go away.  You have blood or something green (bile) in your throw-up.  You have blood in your poop  (stool). This may cause poop to look black and tarry.  You have not peed in 6-8 hours.  You pass out (faint).  Your heart rate when you are sitting still is more than 100 beats a minute.  You have trouble breathing. This information is not intended to replace advice given to you by your health care provider. Make sure you discuss any questions you have with your health care provider. Document Released: 04/07/2009 Document Revised: 12/30/2015 Document Reviewed: 08/05/2015 Elsevier Interactive Patient Education  2018 [employer].

## 2017-01-22 ENCOUNTER — Telehealth: Payer: Self-pay | Admitting: Nurse Practitioner

## 2017-01-22 NOTE — Telephone Encounter (Signed)
Spoke with wife and discussed that patient probably needs to see his PCP for headaches. Patient is seen in this office for seizures. Wife stated that he doesn't drink much water, and after he did earlier today he told her he felt better. Advised he may be having headaches related some allergy difficulties and  frequent weather changes as well.  Wife stated she thought it was a good idea to contact his PCP. She verbalized understanding, appreciation of call back.
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
## 2017-01-24 ENCOUNTER — Encounter: Payer: Self-pay | Admitting: Physician Assistant

## 2017-01-24 ENCOUNTER — Ambulatory Visit (INDEPENDENT_AMBULATORY_CARE_PROVIDER_SITE_OTHER): Payer: BLUE CROSS/BLUE SHIELD | Admitting: Physician Assistant

## 2017-01-24 VITALS — BP 124/74 | HR 75 | Temp 97.9°F | Resp 14 | Wt 344.0 lb

## 2017-01-24 DIAGNOSIS — E349 Endocrine disorder, unspecified: Secondary | ICD-10-CM | POA: Diagnosis not present

## 2017-01-24 DIAGNOSIS — Z87820 Personal history of traumatic brain injury: Secondary | ICD-10-CM | POA: Diagnosis not present

## 2017-01-24 NOTE — Progress Notes (Signed)
Patient ID: Richard Cardenas MRN: [id number], DOB: 1965-09-16, 51 y.o. Date of Encounter: 01/24/2017, 8:52 AM    Chief Complaint:  Chief Complaint  Patient presents with  . Headache    on going through the years      HPI: 51 y.o. year old male with h/o Traumatic brain injury. I have seen him for prior visits as well. Again today, as he has at prior visits, after each phrase that he says he says "forgive me for going on and on"---he has fleeting thoughts and spurts out each thought. Also, he starts to tell me why he is for todays visit and then says he cannot really communicate his thoughts and for me to go to lobby and talk to his wife to see if she can help provide further information.  While I am talking to him, he says "Feels like a car battery it keeps getting turned on but no time to recharge" He also mentions that his wife has had surgery and is having to use a walker --- mentions that doesn't know if this is affecting him. He also mentions that [employer] is now being run differently than it used to--- doesn't know if that's affecting him. He also points to left for head which has scar from prior injury and says that he doesn't know if that pain there is contributing to the way he is feeling.  When I went and spoke with his wife she says that one thing she knows that she thinks needs to be rechecked as his testosterone. In the past as testosterone level was low and was treated with something that he applied to his scan and it irritated his skin so he had to stop using it and then never got that really checked. She also says that he does complain of headache and usually will point, across his for head this area that hurts. She says that she notices him urinating frequently but knows that he has not been having any burning when he urinates or any pain there. Doesn't know if that's anything to check. Says that is all she can think of.     Home Meds:   Outpatient Medications Prior to  Visit  Medication Sig Dispense Refill  . acetaminophen (TYLENOL) 500 MG tablet Take 500 mg by mouth every 8 (eight) hours as needed.    . cetirizine (ZYRTEC) 10 MG tablet Take 10 mg by mouth daily.    Marland Kitchen levETIRAcetam (KEPPRA) 1000 MG tablet Take 1 tablet (1,000 mg total) by mouth 2 (two) times daily. 180 tablet 3  . Multiple Vitamin (MULTIVITAMIN) tablet Take 1 tablet by mouth daily.    . Pyridoxine HCl (VITAMIN B-6) 500 MG tablet Take 500 mg by mouth daily.    . vitamin C (ASCORBIC ACID) 500 MG tablet Take 500 mg by mouth daily.    . hydroxypropyl methylcellulose / hypromellose (ISOPTO TEARS / GONIOVISC) 2.5 % ophthalmic solution Place 1 drop into both eyes 2 (two) times daily.     No facility-administered medications prior to visit.     Allergies:  Allergies  Allergen Reactions  . Ampicillin Rash      Review of Systems: See HPI for pertinent ROS. All other ROS negative.    Physical Exam: Blood pressure 124/74, pulse 75, temperature 97.9 F (36.6 C), temperature source Oral, resp. rate 14, weight (!) 344 lb (156 kg), SpO2 97 %., Body mass index is 41.87 kg/m. General:  WM. Appears in no acute distress. Neck:  Supple. No thyromegaly. No lymphadenopathy. Lungs: Clear bilaterally to auscultation without wheezes, rales, or rhonchi. Breathing is unlabored. Heart: Regular rhythm. No murmurs, rubs, or gallops. Abdomen: Soft, non-tender, non-distended with normoactive bowel sounds. No hepatomegaly. No rebound/guarding. No obvious abdominal masses. Msk:  Strength and tone normal for age. Extremities/Skin: Warm and dry.  Neuro: Alert and oriented X 3. Moves all extremities spontaneously. Gait is normal.  Psych:  Mood/affect appropriate.      ASSESSMENT AND PLAN:  51 y.o. year old male with  1. Testosterone deficiency Will recheck Testosterone Level --- If low and needs Rx--will discuss Injections----had itching, irritation with topical treatment in past. - Testosterone  2. History  of traumatic brain injury A lot of what he is telling me sounds like he feels overwhelmed by different thoughts going through his mind and constantly processing different thoughts. Think all of this is related to his brain injury. Regarding wife reporting frequent urination he did have PSA 08/20/16 which was normal. He had full panel of labs 08/20/16 which were normal.   Signed, [facility] [place], [place], [facility] 01/24/2017 8:52 AM

## 2017-01-25 LAB — TESTOSTERONE: Testosterone: 143 ng/dL — ABNORMAL LOW (ref 250–827)

## 2017-01-29 ENCOUNTER — Other Ambulatory Visit: Payer: Self-pay

## 2017-01-29 MED ORDER — TESTOSTERONE CYPIONATE 200 MG/ML IM SOLN: 200.0000 mg | Freq: Once | INTRAMUSCULAR | 0 refills | Status: AC

## 2017-02-19 DIAGNOSIS — H6123 Impacted cerumen, bilateral: Secondary | ICD-10-CM | POA: Insufficient documentation

## 2017-02-19 DIAGNOSIS — H93293 Other abnormal auditory perceptions, bilateral: Secondary | ICD-10-CM | POA: Insufficient documentation

## 2017-02-28 ENCOUNTER — Telehealth: Payer: Self-pay | Admitting: Nurse Practitioner

## 2017-02-28 NOTE — Telephone Encounter (Addendum)
Called patient who stated he does take Tylenol for headaches, and "it helps some". He stated Excedrin is more helpful. He has not been working and has been indoors more than outdoors recently, stated he does drink water but maybe not enough. Patient stated he wants to know if he can take Excedrin for his headaches, and if not, what can he take.   Patient will return to [place] center work on Saturday. This RN stated she will discuss with NP and call him back.  Patient verbalized understanding, appreciation.

## 2017-02-28 NOTE — Telephone Encounter (Signed)
Over-the-counter medications are not recommended long-term for headaches. As stated before you should  Stay well hydrated, eating correctly so that you are not having fluctuations in your blood sugars.

## 2017-02-28 NOTE — Telephone Encounter (Signed)
Pt calling to inform that he will be returning to work on Saturday the 8th and would like to know if NP Hoyle Sauer would okay him taking Excedrine for his head aches  And balance or is she would call in something else for him, pt is asking for a call back

## 2017-02-28 NOTE — Telephone Encounter (Signed)
Spoke with patient and gave him NP's detailed instructions. He stated that he has to go back to work Sat, expressed concern. Patient had stated that [employer] provides water to outside employees. This RN advised he must stay well hydrated and eat well. Advised he call back for further concerns, questions. Patient verbalized agreement, appreciation,  understanding.

## 2017-06-25 HISTORY — PX: KNEE SURGERY: SHX244

## 2017-08-26 ENCOUNTER — Telehealth: Payer: Self-pay | Admitting: Neurology

## 2017-08-26 NOTE — Telephone Encounter (Signed)
I spoke with patient's wife and was able to get him in for an appt with Hoyle Sauer on Wednesday, 08/28/17. She is aware of check in time and appreciates [place] getting him in sooner. We agreed to keep appt with Dr. Krista Blue scheduled just in case CM feels that he does need to follow up in a couple of months.

## 2017-08-26 NOTE — Telephone Encounter (Signed)
Pt wife(on DPR) has called stating that pt is having an increase in head aches, sleepiness and she wants him to be seen before June.  Pt is on wait list but would like to be called if there is a way to get him in earlier
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
## 2017-08-28 ENCOUNTER — Encounter: Payer: Self-pay | Admitting: Nurse Practitioner

## 2017-08-28 ENCOUNTER — Ambulatory Visit (INDEPENDENT_AMBULATORY_CARE_PROVIDER_SITE_OTHER): Payer: BLUE CROSS/BLUE SHIELD | Admitting: Nurse Practitioner

## 2017-08-28 VITALS — BP 147/78 | HR 70 | Wt 365.4 lb

## 2017-08-28 DIAGNOSIS — Z87820 Personal history of traumatic brain injury: Secondary | ICD-10-CM | POA: Diagnosis not present

## 2017-08-28 DIAGNOSIS — G40909 Epilepsy, unspecified, not intractable, without status epilepticus: Secondary | ICD-10-CM

## 2017-08-28 DIAGNOSIS — R51 Headache: Secondary | ICD-10-CM | POA: Diagnosis not present

## 2017-08-28 DIAGNOSIS — R519 Headache, unspecified: Secondary | ICD-10-CM | POA: Insufficient documentation

## 2017-08-28 DIAGNOSIS — R400 Somnolence: Secondary | ICD-10-CM

## 2017-08-28 NOTE — Patient Instructions (Signed)
Continue Keppra at current dose will refill  Call for seizure activity Set up for sleep evaluation ESS 14, FSS 48 I explained in particular the risks and ramifications of untreated moderate to severe OSA, especially with respect to cardiovascular disease  including congestive heart failure, difficult to treat hypertension, cardiac arrhythmias, or stroke. Even type 2 diabetes has, in part, been linked to untreated OSA. Symptoms of untreated OSA include daytime sleepiness, memory problems, mood irritability and mood disorder such as depression and anxiety, lack of energy, as well as recurrent headaches, especially morning headaches.  I encouraged the patient to eat healthy, exercise daily and keep well hydrated, to keep a scheduled bedtime and wake time routine, to not skip any meals and eat healthy snacks in between meals Follow-up with the sleep doctor for evaluation and sleep study .

## 2017-08-30 NOTE — Progress Notes (Signed)
I have reviewed and agreed above plan. 

## 2017-10-23 ENCOUNTER — Institutional Professional Consult (permissible substitution): Payer: BLUE CROSS/BLUE SHIELD | Admitting: Neurology

## 2017-11-11 DIAGNOSIS — G4733 Obstructive sleep apnea (adult) (pediatric): Secondary | ICD-10-CM | POA: Insufficient documentation

## 2017-12-02 ENCOUNTER — Ambulatory Visit: Payer: BLUE CROSS/BLUE SHIELD | Admitting: Neurology

## 2017-12-02 ENCOUNTER — Encounter

## 2017-12-12 ENCOUNTER — Other Ambulatory Visit: Payer: Self-pay | Admitting: Nurse Practitioner

## 2017-12-19 ENCOUNTER — Ambulatory Visit: Payer: BLUE CROSS/BLUE SHIELD | Admitting: Nurse Practitioner

## 2018-02-18 DIAGNOSIS — J342 Deviated nasal septum: Secondary | ICD-10-CM | POA: Insufficient documentation

## 2018-02-18 DIAGNOSIS — J302 Other seasonal allergic rhinitis: Secondary | ICD-10-CM | POA: Insufficient documentation

## 2018-03-13 ENCOUNTER — Other Ambulatory Visit: Payer: Self-pay | Admitting: Nurse Practitioner

## 2018-03-16 ENCOUNTER — Other Ambulatory Visit: Payer: Self-pay | Admitting: Nurse Practitioner

## 2018-03-24 ENCOUNTER — Telehealth: Payer: Self-pay | Admitting: Nurse Practitioner

## 2018-03-24 NOTE — Telephone Encounter (Signed)
Pt's wife/Deana DPR pt is having numbness/thingling, in hands and feet for the past month progressively getting worse. She is concerned it could be associated with his seizures. Please call to advise

## 2018-03-24 NOTE — Telephone Encounter (Signed)
Spoke to wife.  Pt has started with numbness, tingling, bilateral hands and feet, (noted some burning sensation in feet).  This has progressively gotten worse.  I instructed wife to see pcp initially for evaluation, and if recommends to see neuro, can address at 08/2018 appt with Dr. Krista Blue.  Wife ok with this.

## 2018-03-26 ENCOUNTER — Other Ambulatory Visit: Payer: Self-pay | Admitting: Physician Assistant

## 2018-03-26 DIAGNOSIS — R1310 Dysphagia, unspecified: Secondary | ICD-10-CM

## 2018-03-27 ENCOUNTER — Ambulatory Visit
Admission: RE | Admit: 2018-03-27 | Discharge: 2018-03-27 | Disposition: A | Discharge status: Home / Self Care | Payer: Self-pay | Source: Ambulatory Visit | Attending: Physician Assistant | Admitting: Physician Assistant

## 2018-03-27 DIAGNOSIS — H9313 Tinnitus, bilateral: Secondary | ICD-10-CM | POA: Insufficient documentation

## 2018-03-27 DIAGNOSIS — R1310 Dysphagia, unspecified: Secondary | ICD-10-CM | POA: Insufficient documentation

## 2018-03-27 DIAGNOSIS — H9011 Conductive hearing loss, unilateral, right ear, with unrestricted hearing on the contralateral side: Secondary | ICD-10-CM | POA: Insufficient documentation

## 2018-04-02 ENCOUNTER — Other Ambulatory Visit: Payer: Self-pay | Admitting: Family Medicine

## 2018-04-02 DIAGNOSIS — M25561 Pain in right knee: Secondary | ICD-10-CM

## 2018-04-03 ENCOUNTER — Other Ambulatory Visit: Payer: Self-pay

## 2018-04-06 ENCOUNTER — Ambulatory Visit
Admission: RE | Admit: 2018-04-06 | Discharge: 2018-04-06 | Disposition: A | Discharge status: Home / Self Care | Payer: Self-pay | Source: Ambulatory Visit | Attending: Family Medicine | Admitting: Family Medicine

## 2018-04-06 ENCOUNTER — Other Ambulatory Visit: Payer: Self-pay

## 2018-04-06 DIAGNOSIS — M25561 Pain in right knee: Secondary | ICD-10-CM

## 2018-04-16 ENCOUNTER — Ambulatory Visit (INDEPENDENT_AMBULATORY_CARE_PROVIDER_SITE_OTHER): Payer: BLUE CROSS/BLUE SHIELD | Admitting: Orthopedic Surgery

## 2018-04-16 ENCOUNTER — Encounter (INDEPENDENT_AMBULATORY_CARE_PROVIDER_SITE_OTHER): Payer: Self-pay | Admitting: Orthopedic Surgery

## 2018-04-16 DIAGNOSIS — S838X1A Sprain of other specified parts of right knee, initial encounter: Secondary | ICD-10-CM

## 2018-04-17 ENCOUNTER — Encounter (INDEPENDENT_AMBULATORY_CARE_PROVIDER_SITE_OTHER): Payer: Self-pay | Admitting: Orthopedic Surgery

## 2018-04-17 DIAGNOSIS — S838X1A Sprain of other specified parts of right knee, initial encounter: Secondary | ICD-10-CM | POA: Diagnosis not present

## 2018-04-17 MED ORDER — BUPIVACAINE HCL 0.25 % IJ SOLN
4.0000 mL | INTRAMUSCULAR | Status: AC | PRN
  Administered 2018-04-17: 4 mL via INTRA_ARTICULAR

## 2018-04-17 MED ORDER — LIDOCAINE HCL 1 % IJ SOLN
5.0000 mL | INTRAMUSCULAR | Status: AC | PRN
  Administered 2018-04-17: 5 mL

## 2018-04-17 MED ORDER — METHYLPREDNISOLONE ACETATE 40 MG/ML IJ SUSP
40.0000 mg | INTRAMUSCULAR | Status: AC | PRN
  Administered 2018-04-17: 40 mg via INTRA_ARTICULAR
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
## 2018-04-18 ENCOUNTER — Telehealth (INDEPENDENT_AMBULATORY_CARE_PROVIDER_SITE_OTHER): Payer: Self-pay | Admitting: Orthopedic Surgery

## 2018-04-18 NOTE — Telephone Encounter (Signed)
Patient is wondering if he can take extra tylenol or anything else for the right leg tendon tightness/aching he is experiencing below his knee in his calf. He said the feeling is the same as when he tore ligaments in his feet.  Can call on either # [phone] or # [phone]

## 2018-04-18 NOTE — Telephone Encounter (Signed)
Please advise. Thanks.  

## 2018-04-23 ENCOUNTER — Telehealth (INDEPENDENT_AMBULATORY_CARE_PROVIDER_SITE_OTHER): Payer: Self-pay | Admitting: Orthopedic Surgery

## 2018-04-23 NOTE — Telephone Encounter (Signed)
Patient states he received a shot in his right knee, and the pain is still as bad if not worse now after he received the shot. He is taking tramadol for pain but,  Patient states that he needs to lengthen the amount of time that he is out of work due to the amount of pain he is in. Patient states 11/14 is the target date to return back to work for this claim.   Patient # [phone]

## 2018-04-23 NOTE — Telephone Encounter (Signed)
Please advise if ok for note?

## 2018-04-24 NOTE — Telephone Encounter (Signed)
He just wants note extended until 14th. He plans to return to work on 05/08/18.

## 2018-04-24 NOTE — Telephone Encounter (Signed)
How long past 14th

## 2018-04-25 NOTE — Telephone Encounter (Signed)
ok 

## 2018-04-25 NOTE — Telephone Encounter (Signed)
IC to advise done, but they wanted note only through 04/30/18 which is patients next OV. Continues to be in a lot of pain without any relief from injection. Will discuss at next OV

## 2018-04-25 NOTE — Telephone Encounter (Signed)
See other note

## 2018-04-25 NOTE — Telephone Encounter (Signed)
[place]edy for ice/anti-inflammatories

## 2018-04-30 ENCOUNTER — Encounter (INDEPENDENT_AMBULATORY_CARE_PROVIDER_SITE_OTHER): Payer: Self-pay | Admitting: Orthopedic Surgery

## 2018-04-30 ENCOUNTER — Ambulatory Visit (INDEPENDENT_AMBULATORY_CARE_PROVIDER_SITE_OTHER): Payer: BLUE CROSS/BLUE SHIELD | Admitting: Orthopedic Surgery

## 2018-04-30 DIAGNOSIS — S838X1A Sprain of other specified parts of right knee, initial encounter: Secondary | ICD-10-CM | POA: Diagnosis not present
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
## 2018-05-06 ENCOUNTER — Telehealth (INDEPENDENT_AMBULATORY_CARE_PROVIDER_SITE_OTHER): Payer: Self-pay | Admitting: Orthopedic Surgery

## 2018-05-06 NOTE — Telephone Encounter (Signed)
Received vm from patient needing to get his records sent to [facility]. I called him back [phone], spoke with pts wife and advised he would need to sign release form prior to sending records. She stated his appt is today and I told her I would certainly fax records timely as long as I had the release form.

## 2018-06-09 ENCOUNTER — Other Ambulatory Visit: Payer: Self-pay | Admitting: Neurology

## 2018-06-27 ENCOUNTER — Telehealth: Payer: Self-pay | Admitting: Nurse Practitioner

## 2018-06-27 NOTE — Telephone Encounter (Signed)
Pt has called asking if Dr Krista Blue can make the referral for a sleep study for him, please call

## 2018-06-27 NOTE — Telephone Encounter (Signed)
I spoke with pt and he relayed messages to his wife while on phone. Discussed that a referral had been written back in march 2019 however pt had canceled the referral because he had a study performed somewhere else. RN advised that per Hoyle Sauer patient needs to follow up with the sleep doctor he has seen in the past. Advised pt to follow-up with them on daytime somnolence, morning headaches, weight gain, elevated ESS. Upon review of chart, pt has seen [facility] pulmonology, A. Meier PA. He was given the number to call. Pt stated he also had questions regarding his CPAP equipment of which Rn advised that he call his DME company. Pt stated it was [employer]. He verbalized appreciation and had no further questions.

## 2018-06-30 ENCOUNTER — Telehealth (INDEPENDENT_AMBULATORY_CARE_PROVIDER_SITE_OTHER): Payer: Self-pay | Admitting: Radiology

## 2018-06-30 NOTE — Telephone Encounter (Signed)
Wife called, LMVM, there is a problem with info submitted to [employer] company and they need this corrected so patient can get his salary/paycheck from [employer].  I have called, LM asked her to call me back to discuss.

## 2018-07-02 ENCOUNTER — Encounter (INDEPENDENT_AMBULATORY_CARE_PROVIDER_SITE_OTHER): Payer: Self-pay | Admitting: Radiology

## 2018-07-02 NOTE — Telephone Encounter (Signed)
Dr Marlou Sa and I reviewed chart, and I have called Deana back and advised we cannot remove statement from disability form.  We can update the disability form with a note expaining.  She understands.  See other note.

## 2018-07-02 NOTE — Progress Notes (Signed)
I spoke with patient's wife, Deliah Goody, whom he has given [place] permission to speak with about his medical history.    She tells me that per patient, he did injure his knee in December of 2018 at work.  He was see by a nurse only, and took OTC meds for this injury.  He was not see by a skilled medical practitioner, as pain from this injury resolved.    He was at work on 03/22/18, and was going up and down a three step ladder to reach items on a high shelf.  He had some discomfort in the right knee after this.  He also had stiffness the next day.  He was not seen and evaluated for right knee pain by WC.  He did not file a WC claim as there was no injury.  He did go to his PCP at [facility] in [place], [place], Dr. Terrill Mohr on 03/26/18. She evaluated him for lower extremity edema, and he did complain of right knee soreness at that time.  She initiated treatment and eventually referred him to Dr. Marlou Sa here at [facility].    This info is documented in chart at patient's request, and is to be corelated with [employer] form sent in.    Faxed this note as well to [facility].

## 2018-09-03 ENCOUNTER — Ambulatory Visit: Payer: BLUE CROSS/BLUE SHIELD | Admitting: Nurse Practitioner

## 2018-09-09 ENCOUNTER — Telehealth: Payer: Self-pay | Admitting: Neurology

## 2018-09-09 MED ORDER — LEVETIRACETAM 1000 MG PO TABS: 1000.0000 mg | ORAL_TABLET | Freq: Two times a day (BID) | ORAL | 4 refills | Status: DC

## 2018-09-09 NOTE — Telephone Encounter (Signed)
I have called patient, he has not had recurrent seizure, doing well on Keppra 1000 mg twice a day, he and his wife recently suffered upper respiratory symptoms, also had knee surgery  Please cancel his appointment, he will call back in 6 months to reschedule.

## 2018-09-10 ENCOUNTER — Ambulatory Visit: Payer: BLUE CROSS/BLUE SHIELD | Admitting: Neurology

## 2018-11-05 DIAGNOSIS — G8929 Other chronic pain: Secondary | ICD-10-CM | POA: Insufficient documentation

## 2018-11-05 DIAGNOSIS — M2550 Pain in unspecified joint: Secondary | ICD-10-CM | POA: Insufficient documentation
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
## 2019-03-11 ENCOUNTER — Other Ambulatory Visit: Payer: Self-pay

## 2019-03-11 ENCOUNTER — Ambulatory Visit (INDEPENDENT_AMBULATORY_CARE_PROVIDER_SITE_OTHER): Payer: BC Managed Care – PPO | Admitting: Neurology

## 2019-03-11 ENCOUNTER — Encounter: Payer: Self-pay | Admitting: Neurology

## 2019-03-11 VITALS — BP 128/81 | HR 62 | Temp 98.2°F | Ht 76.0 in | Wt 336.8 lb

## 2019-03-11 DIAGNOSIS — G40909 Epilepsy, unspecified, not intractable, without status epilepticus: Secondary | ICD-10-CM

## 2019-03-11 NOTE — Patient Instructions (Addendum)
Continue Keppra. Follow-up with primary care doctor. Keppra was filled in March for 1 year.

## 2019-03-12 NOTE — Progress Notes (Signed)
I have reviewed and agreed above plan. 

## 2020-03-24 IMAGING — MR MR KNEE*R* W/O CM
4 of 7 series · 23 of 40 positions shown · non-contrast
Comparison: None.

CLINICAL DATA: Right knee pain since injury 3 weeks ago at work. On
oral steroids.

EXAM:
MRI OF THE RIGHT KNEE WITHOUT CONTRAST
TECHNIQUE: Multiplanar, multisequence MR imaging of the knee was performed. No
intravenous contrast was administered.

[Series 4: T2 fat-sat · coronal · 4.0mm · 0.59mm/px · 6 of 29 slices shown (1 of 2)]
[im 1/29]
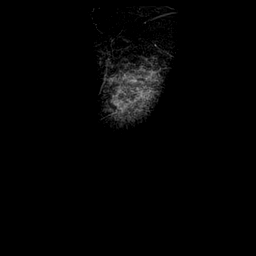
[im 6/29]
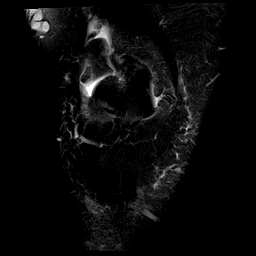
[im 12/29]
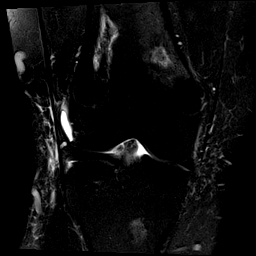
[im 17/29]
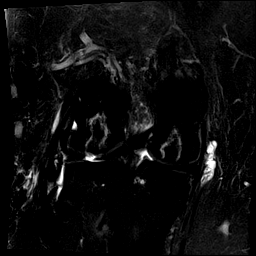
[im 23/29]
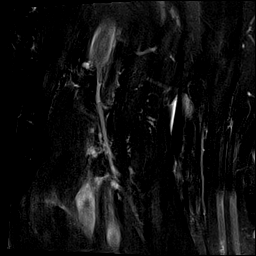
[im 29/29]
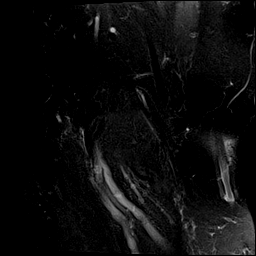

[Series 5: T1 · coronal · 4.0mm · 0.29mm/px · 5 of 29 slices shown]
[im 1/29]
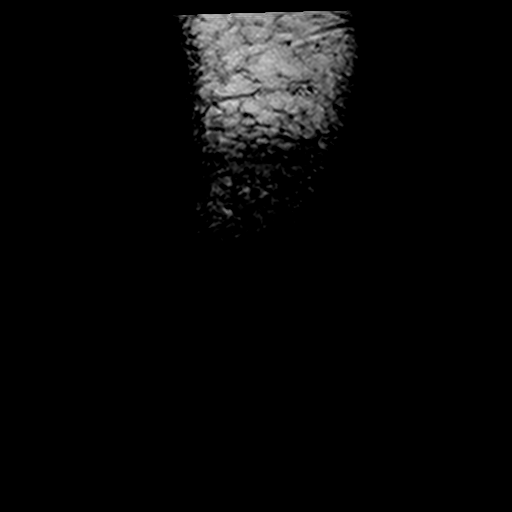
[im 6/29]
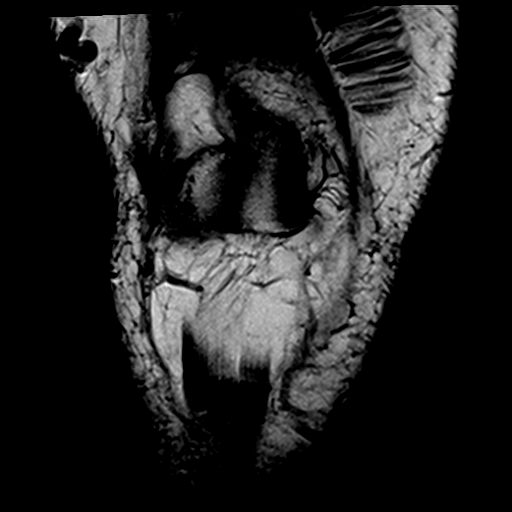
[im 12/29]
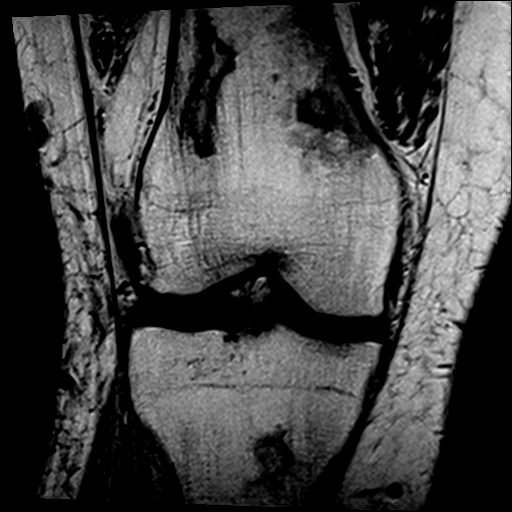
[im 17/29]
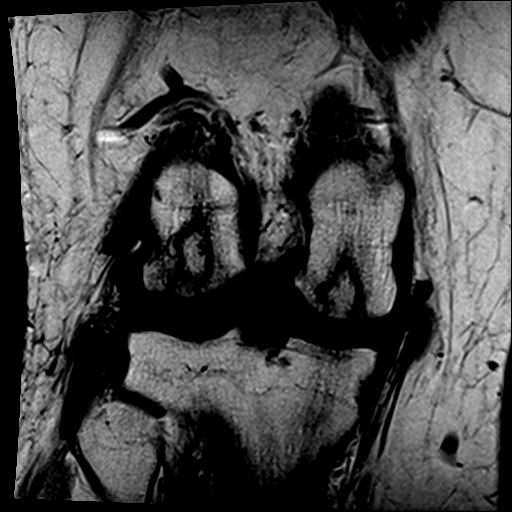
[im 29/29]
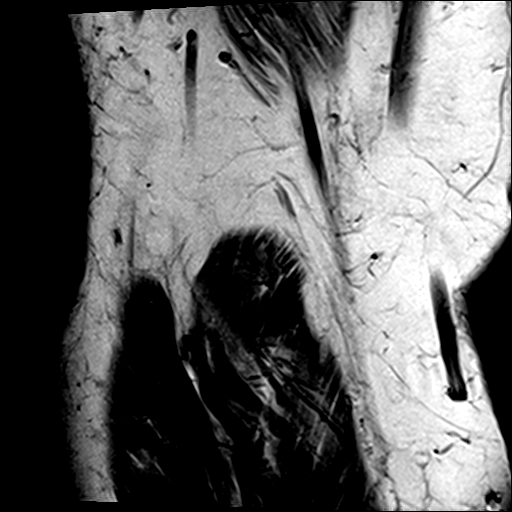

[Series 7: PD fat-sat · sagittal · 3.0mm · 0.29mm/px · 6 of 30 slices shown]
[im 1/30]
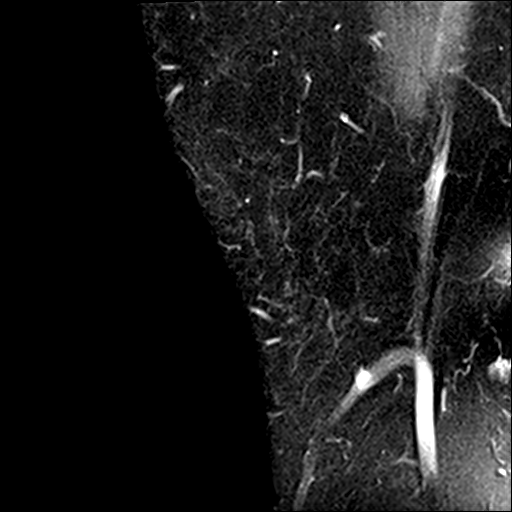
[im 6/30]
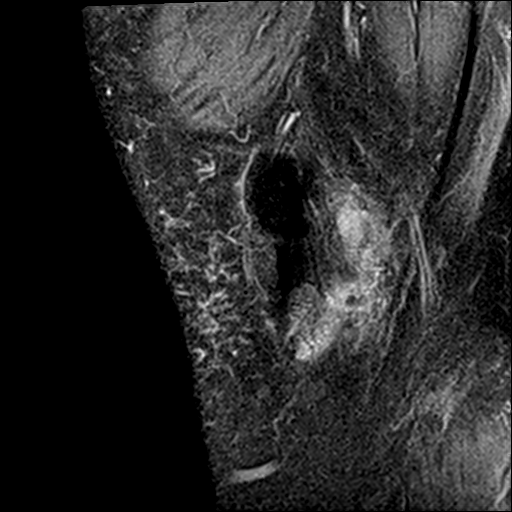
[im 12/30]
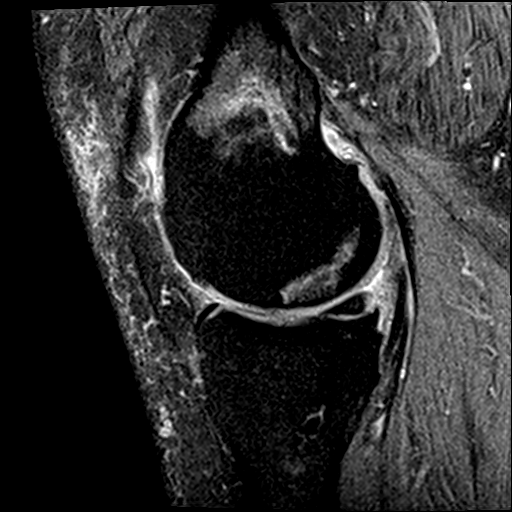
[im 18/30]
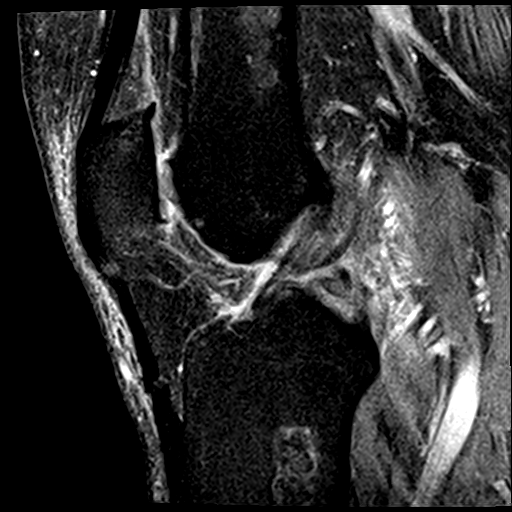
[im 24/30]
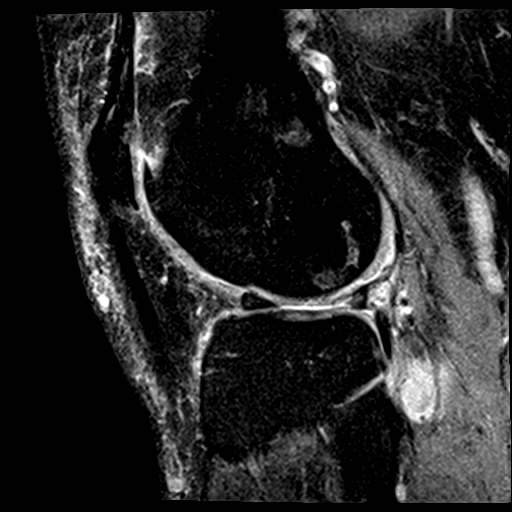
[im 30/30]
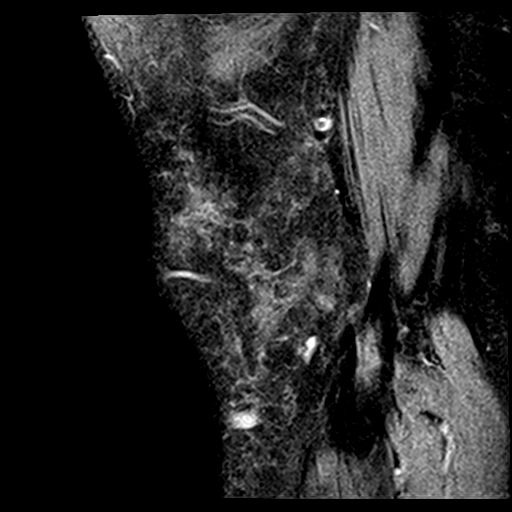

[Series 8: T2 fat-sat · sagittal · 3.0mm · 0.29mm/px · 6 of 30 slices shown (2 of 2)]
[im 1/30]
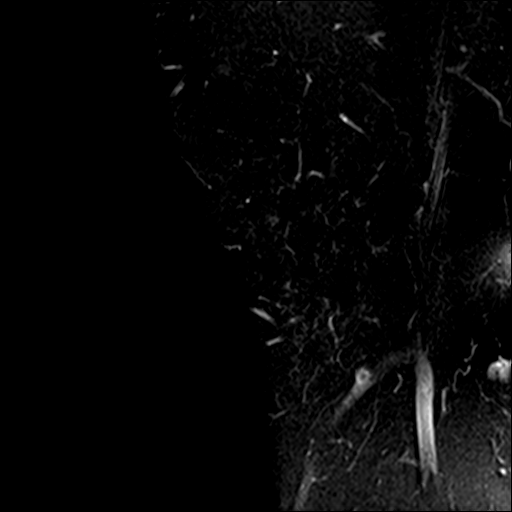
[im 6/30]
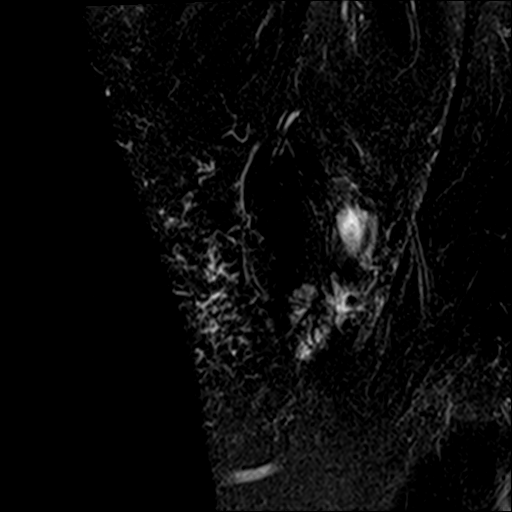
[im 12/30]
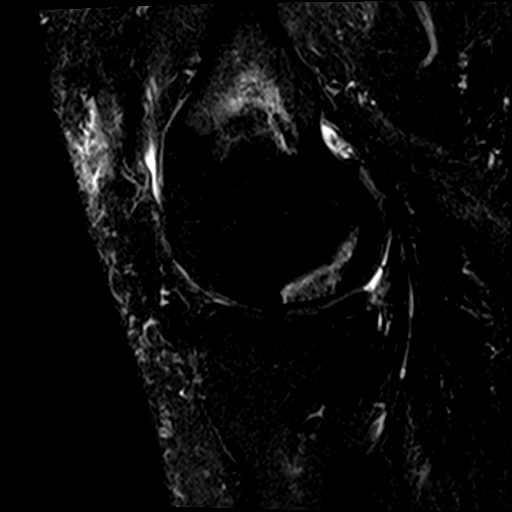
[im 18/30]
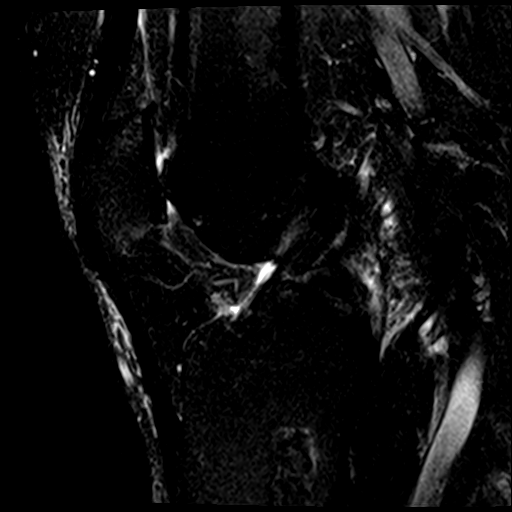
[im 24/30]
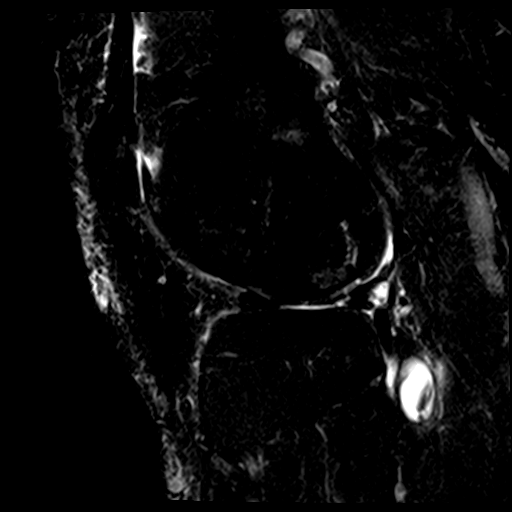
[im 30/30]
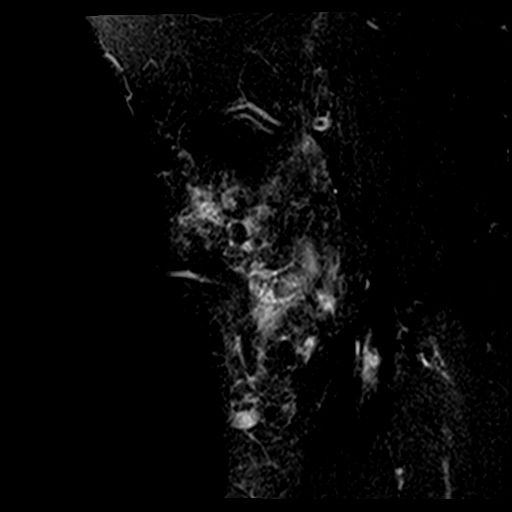

[23 of 40 positions shown; findings below may reference images not displayed]

FINDINGS: MENISCI

Medial meniscus:  Intact

Lateral meniscus: Complex tear of the posterior horn of the lateral
meniscus with dominant inferior articular surfacing component,
series 7, images 21 through 24.

LIGAMENTS

Cruciates:  Intact ACL and PCL.

Collaterals:  Intact

CARTILAGE

Patellofemoral: Marked chondral thinning of the lateral patellar
cartilage with subchondral edema and near bone-on-bone apposition
noted. Lateral spurring is also seen off the patella.

Medial: Mild partial-thickness cartilage loss of the medial
femorotibial compartment.

Lateral: Full-thickness chondral loss along the weight-bearing
portion of the lateral femoral condyle measuring up to 6 mm
transverse, series [DATE].

Joint:  Small joint effusion.  No plical thickening.

Popliteal Fossa:  Small popliteal cyst.  Intact popliteus.

Extensor Mechanism: Infrapatellar tendinosis of the patellar tendon.

Bones: Serpiginous, circumscribed abnormalities within the distal
femoral metadiaphysis, femoral condyles and tibial metadiaphysis
with T1 hypointense rim and fatty centers compatible with bone
infarcts. No flattening of the femoral condyles along their
weight-bearing portion despite the presence of the subchondral bone
infarcts. No acute fracture or other suspicious osseous lesions.

Other: Mild prepatellar subcutaneous soft tissue edema.
IMPRESSION: 1. Bone infarcts of the distal femoral and proximal tibial
metadiaphyses as well as both femoral condyles. No flattening of the
femoral condyles despite the subchondral bone infarcts noted.
2. Complex tear of the posterior horn of the lateral meniscus
predominantly involving the inferior articular surface.
3. Intact cruciate and collateral ligaments.
4. Patellar tendinosis.

## 2020-04-13 ENCOUNTER — Ambulatory Visit (INDEPENDENT_AMBULATORY_CARE_PROVIDER_SITE_OTHER): Payer: 59 | Admitting: Pulmonary Disease

## 2020-04-13 ENCOUNTER — Other Ambulatory Visit: Payer: Self-pay

## 2020-04-13 ENCOUNTER — Encounter: Payer: Self-pay | Admitting: Pulmonary Disease

## 2020-04-13 VITALS — BP 134/80 | HR 73 | Temp 97.2°F | Ht 75.5 in | Wt 370.0 lb

## 2020-04-13 DIAGNOSIS — Z9989 Dependence on other enabling machines and devices: Secondary | ICD-10-CM

## 2020-04-13 DIAGNOSIS — G4733 Obstructive sleep apnea (adult) (pediatric): Secondary | ICD-10-CM | POA: Diagnosis not present
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
## 2020-04-13 NOTE — Patient Instructions (Signed)
History of obstructive sleep apnea -Uses CPAP nightly  Excessive daytime sleepiness  Severe musculoskeletal pain and discomfort  Regular exercises Regular activity Weight loss efforts  We will try and get a download from the machine to see how its working  Increase your efforts with respect to regular exercises  Will see you in 6 months Call with concerns

## 2020-10-03 DIAGNOSIS — E291 Testicular hypofunction: Secondary | ICD-10-CM | POA: Diagnosis not present

## 2020-11-02 DIAGNOSIS — B351 Tinea unguium: Secondary | ICD-10-CM | POA: Diagnosis not present

## 2020-11-10 ENCOUNTER — Encounter: Payer: Self-pay | Admitting: Neurology

## 2020-11-10 ENCOUNTER — Ambulatory Visit: Payer: Medicare Other | Admitting: Neurology

## 2020-11-10 VITALS — BP 132/78 | HR 80 | Ht 76.0 in | Wt 335.0 lb

## 2020-11-10 DIAGNOSIS — Z87820 Personal history of traumatic brain injury: Secondary | ICD-10-CM

## 2020-11-10 DIAGNOSIS — G40909 Epilepsy, unspecified, not intractable, without status epilepticus: Secondary | ICD-10-CM | POA: Diagnosis not present

## 2020-11-10 MED ORDER — LEVETIRACETAM 1000 MG PO TABS: 1000.0000 mg | ORAL_TABLET | Freq: Two times a day (BID) | ORAL | 4 refills | Status: DC

## 2020-11-10 NOTE — Patient Instructions (Signed)
Restart Keppra taking twice daily Call for seizures or intolerance  See you back in 1 year
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
## 2020-12-14 DIAGNOSIS — H0102B Squamous blepharitis left eye, upper and lower eyelids: Secondary | ICD-10-CM | POA: Diagnosis not present

## 2020-12-14 DIAGNOSIS — S0512XS Contusion of eyeball and orbital tissues, left eye, sequela: Secondary | ICD-10-CM | POA: Diagnosis not present

## 2020-12-14 DIAGNOSIS — H0102A Squamous blepharitis right eye, upper and lower eyelids: Secondary | ICD-10-CM | POA: Diagnosis not present

## 2020-12-14 DIAGNOSIS — H2513 Age-related nuclear cataract, bilateral: Secondary | ICD-10-CM | POA: Diagnosis not present

## 2021-01-04 ENCOUNTER — Encounter: Payer: Self-pay | Admitting: Dermatology

## 2021-01-04 ENCOUNTER — Other Ambulatory Visit: Payer: Self-pay

## 2021-01-04 ENCOUNTER — Ambulatory Visit: Payer: Medicare Other | Admitting: Dermatology

## 2021-01-04 DIAGNOSIS — L821 Other seborrheic keratosis: Secondary | ICD-10-CM

## 2021-01-04 DIAGNOSIS — D1800 Hemangioma unspecified site: Secondary | ICD-10-CM | POA: Diagnosis not present

## 2021-01-04 DIAGNOSIS — Z1283 Encounter for screening for malignant neoplasm of skin: Secondary | ICD-10-CM | POA: Diagnosis not present

## 2021-01-04 NOTE — Progress Notes (Signed)
   Follow-Up Visit   Subjective  Richard Cardenas is a 55 y.o. male who presents for the following: Skin Problem (Left cheek "dark"- ).  Dark spot to the left abdomen Location:  Duration:  Quality:  Associated Signs/Symptoms: Modifying Factors:  Severity:  Timing: Context:   Objective  Well appearing patient in no apparent distress; mood and affect are within normal limits.   All skin waist up examined.   Assessment & Plan      SKs left collarbone and left temple and Santa Claus on forehead.    I, Lavonna Monarch, MD, have reviewed all documentation for this visit.  The documentation on 01/04/21 for the exam, diagnosis, procedures, and orders are all accurate and complete.

## 2021-01-15 ENCOUNTER — Encounter: Payer: Self-pay | Admitting: Dermatology

## 2021-02-21 ENCOUNTER — Other Ambulatory Visit: Payer: Self-pay

## 2021-02-21 ENCOUNTER — Encounter: Payer: Self-pay | Admitting: Pulmonary Disease

## 2021-02-21 ENCOUNTER — Ambulatory Visit: Payer: Medicare Other | Admitting: Pulmonary Disease

## 2021-02-21 VITALS — BP 136/74 | HR 64 | Temp 98.0°F | Ht 76.0 in | Wt 340.8 lb

## 2021-02-21 DIAGNOSIS — G4733 Obstructive sleep apnea (adult) (pediatric): Secondary | ICD-10-CM | POA: Diagnosis not present

## 2021-02-21 DIAGNOSIS — Z9989 Dependence on other enabling machines and devices: Secondary | ICD-10-CM
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
## 2021-02-21 NOTE — Patient Instructions (Signed)
Continue graded exercises as tolerated  Your CPAP seems to be working very well at present with good control of events  Adequate number of hours of sleep Regular exercises  I will see you back in 6 months  Call with any significant concerns

## 2021-07-04 DIAGNOSIS — H1010 Acute atopic conjunctivitis, unspecified eye: Secondary | ICD-10-CM | POA: Diagnosis not present

## 2021-07-26 DIAGNOSIS — G4733 Obstructive sleep apnea (adult) (pediatric): Secondary | ICD-10-CM | POA: Diagnosis not present

## 2021-08-16 DIAGNOSIS — M109 Gout, unspecified: Secondary | ICD-10-CM | POA: Diagnosis not present

## 2021-08-16 DIAGNOSIS — E559 Vitamin D deficiency, unspecified: Secondary | ICD-10-CM | POA: Diagnosis not present

## 2021-08-16 DIAGNOSIS — E785 Hyperlipidemia, unspecified: Secondary | ICD-10-CM | POA: Diagnosis not present

## 2021-08-23 DIAGNOSIS — M109 Gout, unspecified: Secondary | ICD-10-CM | POA: Diagnosis not present

## 2021-08-23 DIAGNOSIS — E559 Vitamin D deficiency, unspecified: Secondary | ICD-10-CM | POA: Diagnosis not present

## 2021-08-23 DIAGNOSIS — G4733 Obstructive sleep apnea (adult) (pediatric): Secondary | ICD-10-CM | POA: Diagnosis not present

## 2021-08-23 DIAGNOSIS — Z0000 Encounter for general adult medical examination without abnormal findings: Secondary | ICD-10-CM | POA: Diagnosis not present

## 2021-08-23 DIAGNOSIS — E785 Hyperlipidemia, unspecified: Secondary | ICD-10-CM | POA: Diagnosis not present

## 2021-09-19 ENCOUNTER — Telehealth: Payer: Self-pay

## 2021-09-19 NOTE — Telephone Encounter (Signed)
Received forms for a DMV handicap placcard and for a certification of disability.  ? ?The certification of disability is asking for a physician to certify that the patient is totally and permanently disabled. ? ?I called patient to discuss. At the last visit in 10/2020, it appears that patient was working part time and driving. Patient will likely need an appointment with Dr. Krista Blue to discuss this further. No answer, left a message asking him to call us back. ?

## 2021-09-19 NOTE — Telephone Encounter (Signed)
I spoke with Judson Roch, NP. She doesn't think we can certify that paient is totally and permanently disabled. I have already called patient once today to discuss. ? ?Sarah, NP completed the DMV handicap placard form and this has been sent to MR for processing. ?

## 2021-09-20 NOTE — Telephone Encounter (Signed)
Pt came by the office and picked up the DMV placard. He and I verbally discussed the disability paper work. He sts he was requesting this on the basis of his mobility decline and I advised we could not certify for this.  ?Pt will further discuss with PCP or orthopedist. ?

## 2021-10-23 ENCOUNTER — Telehealth: Payer: Self-pay | Admitting: Neurology

## 2021-10-23 NOTE — Telephone Encounter (Signed)
Ladies Sarah's patient is looking for some sort of documentation for work. He wants a letter stating his restrictions since he is working part time and is still on disability. The employer wants to know what he can. Please call his wife to discuss she is on his DPR. They have an upcoming appt but need it in advance. I wil try to help them set up their Mychart. ?Thank you ?

## 2021-10-23 NOTE — Telephone Encounter (Signed)
I spoke to the patient and his wife on speaker phone. He has been evaluated at [facility] for back pain (hx of injury on job). He is asking for restrictions related to lifting. He has been instructed to call his orthopaedic physician.  ?
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
## 2021-11-15 ENCOUNTER — Encounter: Payer: Self-pay | Admitting: Neurology

## 2021-11-15 ENCOUNTER — Ambulatory Visit: Payer: Medicare Other | Admitting: Neurology

## 2021-11-15 VITALS — BP 145/78 | HR 61 | Ht 76.0 in | Wt 346.5 lb

## 2021-11-15 DIAGNOSIS — G40909 Epilepsy, unspecified, not intractable, without status epilepticus: Secondary | ICD-10-CM | POA: Diagnosis not present

## 2021-11-15 MED ORDER — LEVETIRACETAM 1000 MG PO TABS: 1000.0000 mg | ORAL_TABLET | Freq: Two times a day (BID) | ORAL | 4 refills | Status: DC

## 2021-12-01 ENCOUNTER — Telehealth: Payer: Self-pay | Admitting: Pulmonary Disease

## 2021-12-01 NOTE — Telephone Encounter (Signed)
I called the patient and he reports that he does not have an SD card in his machine and I left a message for [employer] to send a fax of the report.

## 2021-12-04 ENCOUNTER — Encounter: Payer: Self-pay | Admitting: Pulmonary Disease

## 2021-12-04 ENCOUNTER — Ambulatory Visit: Payer: Medicare Other | Admitting: Pulmonary Disease

## 2021-12-04 VITALS — BP 124/82 | HR 71 | Ht 76.0 in | Wt 346.0 lb

## 2021-12-04 DIAGNOSIS — G4733 Obstructive sleep apnea (adult) (pediatric): Secondary | ICD-10-CM

## 2021-12-04 DIAGNOSIS — Z9989 Dependence on other enabling machines and devices: Secondary | ICD-10-CM

## 2021-12-04 NOTE — Patient Instructions (Signed)
Continue using your CPAP on a nightly basis  DME referral for CPAP supplies  Call with significant concerns  Weight loss efforts as tolerated
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
## 2021-12-04 NOTE — Telephone Encounter (Signed)
Tonya from [facility] called back and states that they are unable to get a download without his SD card or machine. Kenney Houseman states she is going to reach out to the patient to see if he can bring in his machine.

## 2021-12-05 NOTE — Telephone Encounter (Signed)
A download was provided by the patient. Closing encounter.

## 2021-12-20 DIAGNOSIS — H2513 Age-related nuclear cataract, bilateral: Secondary | ICD-10-CM | POA: Diagnosis not present

## 2022-01-09 ENCOUNTER — Telehealth: Payer: Self-pay | Admitting: Pulmonary Disease

## 2022-01-09 DIAGNOSIS — G4733 Obstructive sleep apnea (adult) (pediatric): Secondary | ICD-10-CM

## 2022-01-09 NOTE — Telephone Encounter (Signed)
Called patient but he did not answer. Left message for him to call back. I only see an order for cpap supplies to [facility].

## 2022-01-24 NOTE — Telephone Encounter (Signed)
Pt walked into the office  He states he does not prefer [facility] and wants another DME to supply his CPAP supplies  Order placed  He did not have new DME preference  Nothing further needed

## 2022-02-02 DIAGNOSIS — M25571 Pain in right ankle and joints of right foot: Secondary | ICD-10-CM | POA: Diagnosis not present

## 2022-02-06 ENCOUNTER — Encounter: Payer: Self-pay | Admitting: Pulmonary Disease

## 2022-02-08 DIAGNOSIS — M1990 Unspecified osteoarthritis, unspecified site: Secondary | ICD-10-CM | POA: Diagnosis not present

## 2022-02-08 DIAGNOSIS — J309 Allergic rhinitis, unspecified: Secondary | ICD-10-CM | POA: Diagnosis not present

## 2022-02-08 DIAGNOSIS — M170 Bilateral primary osteoarthritis of knee: Secondary | ICD-10-CM | POA: Diagnosis not present

## 2022-02-08 DIAGNOSIS — E559 Vitamin D deficiency, unspecified: Secondary | ICD-10-CM | POA: Diagnosis not present

## 2022-02-28 DIAGNOSIS — G4733 Obstructive sleep apnea (adult) (pediatric): Secondary | ICD-10-CM | POA: Diagnosis not present

## 2022-04-16 DIAGNOSIS — H6123 Impacted cerumen, bilateral: Secondary | ICD-10-CM | POA: Diagnosis not present

## 2022-04-18 DIAGNOSIS — Z03818 Encounter for observation for suspected exposure to other biological agents ruled out: Secondary | ICD-10-CM | POA: Diagnosis not present

## 2022-04-18 DIAGNOSIS — R0981 Nasal congestion: Secondary | ICD-10-CM | POA: Diagnosis not present

## 2022-05-25 DIAGNOSIS — H9011 Conductive hearing loss, unilateral, right ear, with unrestricted hearing on the contralateral side: Secondary | ICD-10-CM | POA: Diagnosis not present

## 2022-06-14 DIAGNOSIS — L821 Other seborrheic keratosis: Secondary | ICD-10-CM | POA: Diagnosis not present

## 2022-06-14 DIAGNOSIS — D225 Melanocytic nevi of trunk: Secondary | ICD-10-CM | POA: Diagnosis not present

## 2022-06-14 DIAGNOSIS — L738 Other specified follicular disorders: Secondary | ICD-10-CM | POA: Diagnosis not present

## 2022-06-14 DIAGNOSIS — B078 Other viral warts: Secondary | ICD-10-CM | POA: Diagnosis not present

## 2022-07-05 ENCOUNTER — Encounter: Payer: Self-pay | Admitting: Pulmonary Disease

## 2022-07-05 DIAGNOSIS — M1990 Unspecified osteoarthritis, unspecified site: Secondary | ICD-10-CM | POA: Diagnosis not present

## 2022-07-05 DIAGNOSIS — R5381 Other malaise: Secondary | ICD-10-CM | POA: Diagnosis not present

## 2022-07-06 ENCOUNTER — Telehealth: Payer: Self-pay | Admitting: Neurology

## 2022-07-06 ENCOUNTER — Encounter: Payer: Self-pay | Admitting: Neurology

## 2022-07-06 NOTE — Telephone Encounter (Signed)
Pt wife is calling. Stated she thinks pt had a sleep study done at this office and she needs a copy sent to [facility] please fax: [phone].

## 2022-07-31 DIAGNOSIS — G4733 Obstructive sleep apnea (adult) (pediatric): Secondary | ICD-10-CM | POA: Diagnosis not present

## 2022-08-02 DIAGNOSIS — R5381 Other malaise: Secondary | ICD-10-CM | POA: Diagnosis not present

## 2022-08-02 DIAGNOSIS — M1990 Unspecified osteoarthritis, unspecified site: Secondary | ICD-10-CM | POA: Diagnosis not present

## 2022-08-14 DIAGNOSIS — R0981 Nasal congestion: Secondary | ICD-10-CM | POA: Diagnosis not present

## 2022-08-14 DIAGNOSIS — Z20822 Contact with and (suspected) exposure to covid-19: Secondary | ICD-10-CM | POA: Diagnosis not present

## 2022-08-14 DIAGNOSIS — R6883 Chills (without fever): Secondary | ICD-10-CM | POA: Diagnosis not present

## 2022-08-23 ENCOUNTER — Encounter: Payer: Self-pay | Admitting: Podiatry

## 2022-08-23 ENCOUNTER — Ambulatory Visit: Payer: Medicare Other | Admitting: Podiatry

## 2022-08-23 DIAGNOSIS — M722 Plantar fascial fibromatosis: Secondary | ICD-10-CM

## 2022-08-23 MED ORDER — TRIAMCINOLONE ACETONIDE 40 MG/ML IJ SUSP
20.0000 mg | Freq: Once | INTRAMUSCULAR | Status: AC
  Administered 2022-08-23: 20 mg
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
## 2022-08-24 ENCOUNTER — Ambulatory Visit (INDEPENDENT_AMBULATORY_CARE_PROVIDER_SITE_OTHER): Payer: Medicare Other | Admitting: Podiatry

## 2022-08-24 DIAGNOSIS — M722 Plantar fascial fibromatosis: Secondary | ICD-10-CM

## 2022-08-28 DIAGNOSIS — E785 Hyperlipidemia, unspecified: Secondary | ICD-10-CM | POA: Diagnosis not present

## 2022-08-28 DIAGNOSIS — Z0000 Encounter for general adult medical examination without abnormal findings: Secondary | ICD-10-CM | POA: Diagnosis not present

## 2022-08-28 DIAGNOSIS — E559 Vitamin D deficiency, unspecified: Secondary | ICD-10-CM | POA: Diagnosis not present

## 2022-08-28 DIAGNOSIS — M109 Gout, unspecified: Secondary | ICD-10-CM | POA: Diagnosis not present

## 2022-08-29 ENCOUNTER — Telehealth: Payer: Self-pay | Admitting: Podiatry

## 2022-08-29 NOTE — Telephone Encounter (Signed)
Predetermination for orthotics submitted through [facility] website  - Case number is  [id number]

## 2022-09-03 NOTE — Telephone Encounter (Signed)
[employer] called asking for pricing and Rx of orthotics.   Faxed over Rx to [phone]

## 2022-09-03 NOTE — Addendum Note (Signed)
Addended by: Clovis Riley E on: 09/03/2022 12:00 PM   Modules accepted: Orders

## 2022-09-05 DIAGNOSIS — E559 Vitamin D deficiency, unspecified: Secondary | ICD-10-CM | POA: Diagnosis not present

## 2022-09-05 DIAGNOSIS — Z0000 Encounter for general adult medical examination without abnormal findings: Secondary | ICD-10-CM | POA: Diagnosis not present

## 2022-09-05 DIAGNOSIS — E785 Hyperlipidemia, unspecified: Secondary | ICD-10-CM | POA: Diagnosis not present

## 2022-09-11 ENCOUNTER — Telehealth: Payer: Self-pay | Admitting: Podiatry

## 2022-09-11 NOTE — Telephone Encounter (Signed)
Patient called back and he wants to proceed with the orthotics even though the insurance may not cover anything .

## 2022-09-11 NOTE — Telephone Encounter (Signed)
Received denial from patients insurance for [id number] - orthotics.  Called patient left message on vm  that we received the denial and does he want to proceed without his insurance?  Letter will be sent to scan center to scan in chart .

## 2022-09-12 NOTE — Telephone Encounter (Signed)
Patient called with [employer] on the line , he does not want to proceed with the orthotics at this time.

## 2022-09-24 DIAGNOSIS — G4733 Obstructive sleep apnea (adult) (pediatric): Secondary | ICD-10-CM | POA: Diagnosis not present

## 2022-09-25 ENCOUNTER — Telehealth: Payer: Self-pay | Admitting: Neurology

## 2022-09-25 NOTE — Telephone Encounter (Signed)
I got a certification of disability for property tax exclusion.  Asking for my signature to certify the patient is totally and permanently disabled.  This is not the case.  I called and spoke with the patient and his wife.  They agree.  He has been working part-time for [employer], but is currently on leave.  They asked I shred this paperwork.

## 2022-11-13 ENCOUNTER — Ambulatory Visit: Payer: Medicare Other | Admitting: Podiatry

## 2022-11-13 ENCOUNTER — Encounter: Payer: Self-pay | Admitting: Podiatry

## 2022-11-13 DIAGNOSIS — M722 Plantar fascial fibromatosis: Secondary | ICD-10-CM | POA: Diagnosis not present

## 2022-11-13 DIAGNOSIS — G4733 Obstructive sleep apnea (adult) (pediatric): Secondary | ICD-10-CM | POA: Diagnosis not present

## 2022-11-13 MED ORDER — TRIAMCINOLONE ACETONIDE 40 MG/ML IJ SUSP
40.0000 mg | Freq: Once | INTRAMUSCULAR | Status: AC
  Administered 2022-11-13: 40 mg

## 2022-11-13 NOTE — Progress Notes (Unsigned)
He presents today with his wife stating that he was not able to get the orthotics that his insurance did not want to pay for those at this point we can talk to him today explained to him that there will be $490 and that they will take them into payments.  His wife and he understands this and is ready to proceed with the orthotics.  He is also complaining of bilateral heel pain.  Objective: Also stable oriented x 3 pulses are palpable pain to the right foot greater than that of the left on the palpation medial calcaneal tubercle.  Assessment: Planter fasciitis bilateral.  Plan: Injected bilateral heels today 20 mg Kenalog 5 mg Marcaine.  And he will be receiving his orthotics approximately 1 month

## 2022-11-22 ENCOUNTER — Ambulatory Visit (INDEPENDENT_AMBULATORY_CARE_PROVIDER_SITE_OTHER): Payer: Medicare Other | Admitting: Neurology

## 2022-11-22 ENCOUNTER — Encounter: Payer: Self-pay | Admitting: Neurology

## 2022-11-22 VITALS — BP 185/93 | HR 62 | Ht 76.0 in | Wt 346.5 lb

## 2022-11-22 DIAGNOSIS — G40909 Epilepsy, unspecified, not intractable, without status epilepticus: Secondary | ICD-10-CM

## 2022-11-22 MED ORDER — LEVETIRACETAM 1000 MG PO TABS: 1000.0000 mg | ORAL_TABLET | Freq: Two times a day (BID) | ORAL | 4 refills | Status: DC
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
## 2023-01-03 ENCOUNTER — Ambulatory Visit (INDEPENDENT_AMBULATORY_CARE_PROVIDER_SITE_OTHER): Payer: Medicaid Other | Admitting: Podiatry

## 2023-01-03 DIAGNOSIS — M722 Plantar fascial fibromatosis: Secondary | ICD-10-CM

## 2023-01-03 NOTE — Progress Notes (Signed)
Patient presents today to pick up custom orthotics   Patient was dispensed 1 pair of custom orthotics  Fit was satisfactory. Instructions for break-in and wear was reviewed and a copy was given to the patient.    

## 2023-01-14 ENCOUNTER — Other Ambulatory Visit: Payer: Self-pay

## 2023-01-14 MED ORDER — LEVETIRACETAM 1000 MG PO TABS: 1000.0000 mg | ORAL_TABLET | Freq: Two times a day (BID) | ORAL | 4 refills | Status: DC

## 2023-03-25 NOTE — Progress Notes (Signed)
Seen by casting department

## 2023-04-02 ENCOUNTER — Ambulatory Visit (INDEPENDENT_AMBULATORY_CARE_PROVIDER_SITE_OTHER): Payer: Medicaid Other | Admitting: Audiology

## 2023-04-02 ENCOUNTER — Encounter (INDEPENDENT_AMBULATORY_CARE_PROVIDER_SITE_OTHER): Payer: Self-pay | Admitting: Otolaryngology

## 2023-04-02 ENCOUNTER — Ambulatory Visit (INDEPENDENT_AMBULATORY_CARE_PROVIDER_SITE_OTHER): Payer: Medicaid Other | Admitting: Otolaryngology

## 2023-04-02 VITALS — Ht 75.0 in | Wt 350.0 lb

## 2023-04-02 DIAGNOSIS — J343 Hypertrophy of nasal turbinates: Secondary | ICD-10-CM | POA: Diagnosis not present

## 2023-04-02 DIAGNOSIS — H9011 Conductive hearing loss, unilateral, right ear, with unrestricted hearing on the contralateral side: Secondary | ICD-10-CM

## 2023-04-02 DIAGNOSIS — J310 Chronic rhinitis: Secondary | ICD-10-CM

## 2023-04-02 DIAGNOSIS — J342 Deviated nasal septum: Secondary | ICD-10-CM

## 2023-04-02 DIAGNOSIS — H6123 Impacted cerumen, bilateral: Secondary | ICD-10-CM | POA: Diagnosis not present

## 2023-04-02 NOTE — Progress Notes (Unsigned)
[facility] [address], [address] [place], [place] [address]  Audiological Evaluation   Richard Cardenas was referred today for a hearing evaluation by Dr. Karle Barr.  History: Symptoms Yes Details  Hearing Loss Yes Patient reported hearing loss, more noticeable in the right ear.  Tinnitus/Aural Fullness Yes Patient reported intermittent left tinnitus and aural fullness.  Ear Pain No Patient denied otalgia at this time.  Balance Problems Yes Patient reported imbalance due to weather changes.  Amplification No Patient denied the use of hearing aids at this time.   Tympanogram: Right ear: Normal external ear canal volume with normal middle ear pressure and high tympanic membrane compliance (Type Ad). Left ear: Normal external ear canal volume with normal middle ear pressure and tympanic membrane compliance (Type A).   Hearing Evaluation: The audiogram was completed using conventional audiometric techniques under headphones with good reliability.   The hearing test results indicate: Right ear: Mild conductive hearing loss from [phone] Hz with normal hearing thresholds at 250 and 2000 Hz. Left ear: Normal hearing sensitivity from [phone] Hz.   Speech Recognition Thresholds were obtained at 35 dBHL masked in the right ear and 15 dBHL in the left ear.   Word Recognition Testing was completed using the NU-6 word lists at 75 dBHL with 45 dBHL of masking noise in the right ear and at 55 dBHL in the left ear and the patient scored 96% in the right ear and 100% in the left ear.   Recommendations: Repeat audiogram when changes are perceived or per MD. Consider various tinnitus strategies, including the use of a noise generator, hearing aids, or tinnitus retraining therapy.  [name] [name], AUD  04/02/23

## 2023-04-03 DIAGNOSIS — J343 Hypertrophy of nasal turbinates: Secondary | ICD-10-CM | POA: Insufficient documentation

## 2023-04-03 DIAGNOSIS — J310 Chronic rhinitis: Secondary | ICD-10-CM | POA: Insufficient documentation

## 2023-04-03 DIAGNOSIS — H9011 Conductive hearing loss, unilateral, right ear, with unrestricted hearing on the contralateral side: Secondary | ICD-10-CM | POA: Insufficient documentation

## 2023-04-03 NOTE — Progress Notes (Signed)
Patient ID: Richard Cardenas, male   DOB: [birth year], 57 y.o.   MRN: [id number]  Cc: Hearing loss, chronic nasal obstruction  HPI: The patient is a 57 year old male who presents today complaining of hearing loss and chronic nasal obstruction.  The patient was last seen in 2022.  At that time, he was noted to have bilateral cerumen impaction, asymmetric right ear conductive hearing loss, nasal mucosal congestion, nasal septal deviation, and bilateral inferior turbinate hypertrophy.  He was treated with cerumen disimpaction and Flonase nasal spray.  The patient returns today complaining of persistent nasal obstruction.  His hearing has also progressively worsened, especially over the past few weeks.  He complains of recurrent clogging sensation in his ears.  He also has a history of obstructive sleep apnea.  He is currently using CPAP at night.  He has difficulty using the CPAP machine due to his nasal obstruction.  He denies any facial pain, fever, otalgia, or otorrhea.  Exam: General: Communicates without difficulty, well nourished, no acute distress. Head: Normocephalic, no evidence injury, no tenderness, facial buttresses intact without stepoff. Face/sinus: No tenderness to palpation and percussion. Facial movement is normal and symmetric. Eyes: PERRL, EOMI. No scleral icterus, conjunctivae clear. Neuro: CN II exam reveals vision grossly intact.  No nystagmus at any point of gaze. Auricles: Intact without lesions. EAC: Bilateral cerumen impaction.  Under the operating microscope, the cerumen is carefully removed with a combination of cerumen currette, alligator forceps, and suction catheters.  After the disimpaction procedure, both tympanic membranes are noted to be intact and mobile.  However, the right malleus is noted to be mildly positioned and protruding laterally.  No middle ear effusion is noted. Nose: External evaluation reveals normal support and skin without lesions.  Dorsum is intact.  Anterior  rhinoscopy reveals congested mucosa over anterior aspect of inferior turbinates and intact septum.  No purulence noted. Oral:  Oral cavity and oropharynx are intact, symmetric, without erythema or edema.  Mucosa is moist without lesions. Neck: Full range of motion without pain.  There is no significant lymphadenopathy.  No masses palpable.  Thyroid bed within normal limits to palpation.  Parotid glands and submandibular glands equal bilaterally without mass.  Trachea is midline. Neuro:  CN 2-12 grossly intact. Gait normal.   Procedure:  Flexible Nasal Endoscopy: Description: Risks, benefits, and alternatives of flexible endoscopy were explained to the patient.  Specific mention was made of the risk of throat numbness with difficulty swallowing, possible bleeding from the nose and mouth, and pain from the procedure.  The patient gave oral consent to proceed.  The flexible scope was inserted into the right nasal cavity.  Endoscopy of the interior nasal cavity, superior, inferior, and middle meatus was performed. The sphenoid-ethmoid recess was examined. Edematous mucosa was noted.  No polyp, mass, or lesion was appreciated. Nasal septal deviation noted. Olfactory cleft was clear.  Nasopharynx was clear.  Turbinates were hypertrophied but without mass.  The procedure was repeated on the contralateral side with similar findings.  The patient tolerated the procedure well.   AUDIOMETRIC TESTING:  I reviewed the audiometric result. The test shows asymmetric right ear conductive hearing loss. The speech reception threshold is 35dB AD and 15dB AS. The discrimination score is 96% AD and 100% AS.   Assessment: 1.  Bilateral cerumen impaction.  After the disimpaction procedure, both tympanic membranes are noted to be intact and mobile.  However, the right malleus is noted to be malpositioned and protruding laterally.  No middle  ear effusion is noted.  2.  Chronic rhinitis with nasal mucosal congestion, nasal septal  deviation, and bilateral inferior turbinate hypertrophy.  More than 95% of his nasal passageways are obstructed bilaterally.  3.  No polyps, mass, lesion, or purulent drainage is noted today.   4.  Asymmetric right ear conductive hearing loss.  This may be secondary to his previous right ear trauma.   Plan: 1.  Otomicroscopy with bilateral cerumen disimpaction.   2.  The physical exam and nasal endoscopy findings are reviewed with the patient.  3.  The patient is encouraged to use Flonase nasal spray on a consistent daily basis.  4.  In light of his persistent nasal obstruction, he may benefit from surgical intervention with septoplasty and turbinate reduction.  The risk, benefits, and details of the procedures are reviewed.  Questions are invited and answered.  The patient would like to proceed with the procedures. 5.  The patient is a candidate for hearing amplification.  The hearing aid options are discussed.

## 2023-04-10 ENCOUNTER — Encounter: Payer: Self-pay | Admitting: Neurology

## 2023-04-16 ENCOUNTER — Ambulatory Visit: Payer: Medicaid Other | Admitting: Podiatry

## 2023-04-16 ENCOUNTER — Encounter: Payer: Self-pay | Admitting: Podiatry

## 2023-04-16 DIAGNOSIS — M79674 Pain in right toe(s): Secondary | ICD-10-CM

## 2023-04-16 DIAGNOSIS — M79675 Pain in left toe(s): Secondary | ICD-10-CM | POA: Diagnosis not present

## 2023-04-16 DIAGNOSIS — L608 Other nail disorders: Secondary | ICD-10-CM | POA: Insufficient documentation

## 2023-04-16 DIAGNOSIS — B351 Tinea unguium: Secondary | ICD-10-CM

## 2023-04-16 NOTE — Progress Notes (Signed)
This patient presents to the office with chief complaint of long thick painful big toe nails.  Patient says the nails are painful walking and wearing shoes.  This patient is unable to self treat.  This patient is unable to trim his nails since he is unable to reach his nails.  he presents to the office for preventative foot care services.  General Appearance  Alert, conversant and in no acute stress.  Vascular  Dorsalis pedis and posterior tibial  pulses are palpable  bilaterally.  Capillary return is within normal limits  bilaterally. Temperature is within normal limits  bilaterally.  Neurologic  Senn-Weinstein monofilament wire test within normal limits  bilaterally. Muscle power within normal limits bilaterally.  Nails Thick disfigured discolored nails with subungual debris  hallux nails  bilaterally. No evidence of bacterial infection or drainage bilaterally.  Orthopedic  No limitations of motion  feet .  No crepitus or effusions noted.  No bony pathology or digital deformities noted.  Skin  normotropic skin with no porokeratosis noted bilaterally.  No signs of infections or ulcers noted.     Onychomycosis  Nails  B/L.  Pain in right toes  Pain in left toes  Debridement of nails both feet followed trimming the nails with dremel tool.    RTC 3 months.   Helane Gunther DPM

## 2023-04-25 ENCOUNTER — Ambulatory Visit: Payer: Medicaid Other | Admitting: Podiatry

## 2023-04-25 DIAGNOSIS — M7751 Other enthesopathy of right foot: Secondary | ICD-10-CM | POA: Diagnosis not present

## 2023-04-25 MED ORDER — TRIAMCINOLONE ACETONIDE 40 MG/ML IJ SUSP
20.0000 mg | Freq: Once | INTRAMUSCULAR | Status: AC
  Administered 2023-04-25: 20 mg

## 2023-04-27 NOTE — Progress Notes (Signed)
He presents today states that he had a bad flareup overnight of pain to the second and digital space.  Objective: Pain on palpation of the ankle joint but to a lesser degree than that of the second third interdigital spaces of that same foot right.  Assessment: Capsulitis.  Plan: I injected these areas today with Kenalog and local anesthetic.

## 2023-05-06 ENCOUNTER — Encounter (INDEPENDENT_AMBULATORY_CARE_PROVIDER_SITE_OTHER): Payer: Self-pay

## 2023-05-06 ENCOUNTER — Ambulatory Visit (INDEPENDENT_AMBULATORY_CARE_PROVIDER_SITE_OTHER): Payer: Medicaid Other | Admitting: Otolaryngology

## 2023-05-06 VITALS — Ht 75.0 in | Wt 350.0 lb

## 2023-05-06 DIAGNOSIS — J342 Deviated nasal septum: Secondary | ICD-10-CM | POA: Diagnosis not present

## 2023-05-06 DIAGNOSIS — R0981 Nasal congestion: Secondary | ICD-10-CM | POA: Diagnosis not present

## 2023-05-06 DIAGNOSIS — J310 Chronic rhinitis: Secondary | ICD-10-CM | POA: Diagnosis not present

## 2023-05-06 DIAGNOSIS — J343 Hypertrophy of nasal turbinates: Secondary | ICD-10-CM

## 2023-05-08 NOTE — Progress Notes (Signed)
Patient ID: Richard Cardenas, male   DOB: 1965-07-08, 57 y.o.   MRN: [id number]  Follow-up: Chronic nasal obstruction, facial pressure  HPI: The patient is a 57 year old male who returns today for his follow-up evaluation.  The patient was previously seen for chronic nasal obstruction.  At his last visit, he was noted to have nasal mucosal congestion, nasal septal deviation, and bilateral inferior turbinate hypertrophy.  The patient was treated with Flonase nasal spray and allergy medications.  Despite the treatment, he continues to be symptomatic.  He returns today complaining of severe difficulty breathing through his nose.  He is interested in more definitive treatment.  Exam: General: Communicates without difficulty, well nourished, no acute distress. Head: Normocephalic, no evidence injury, no tenderness, facial buttresses intact without stepoff. Face/sinus: No tenderness to palpation and percussion. Facial movement is normal and symmetric. Eyes: PERRL, EOMI. No scleral icterus, conjunctivae clear. Neuro: CN II exam reveals vision grossly intact. No nystagmus at any point of gaze. Auricles: Intact without lesions. EAC: Both tympanic membranes are noted to be intact and mobile. However, the right malleus is noted to be mildly positioned and protruding laterally. No middle ear effusion is noted. Nose: External evaluation reveals normal support and skin without lesions. Dorsum is intact. Anterior rhinoscopy reveals congested mucosa over anterior aspect of inferior turbinates and deviated septum. No purulence noted. Oral: Oral cavity and oropharynx are intact, symmetric, without erythema or edema. Mucosa is moist without lesions. Neck: Full range of motion without pain. There is no significant lymphadenopathy. No masses palpable. Thyroid bed within normal limits to palpation. Parotid glands and submandibular glands equal bilaterally without mass. Trachea is midline. Neuro: CN 2-12 grossly intact. Gait  normal.   Assessment: 1.  Chronic rhinitis with nasal mucosal congestion, nasal septal deviation, and bilateral inferior turbinate hypertrophy.  More than 95% of his nasal passageways are obstructed bilaterally. 2.  No acute infection is noted today.  Plan: 1.  The physical exam findings are reviewed with the patient. 2.  Continue with Flonase nasal spray daily. 3.  In light of his persistent nasal obstruction, he may benefit from surgical intervention with septoplasty and turbinate reduction.  The risk, benefits, and details of the procedures are reviewed.  Questions are invited and answered.  The patient would like to proceed with the procedures.

## 2023-06-04 ENCOUNTER — Encounter (HOSPITAL_BASED_OUTPATIENT_CLINIC_OR_DEPARTMENT_OTHER): Payer: Self-pay | Admitting: Otolaryngology

## 2023-06-04 ENCOUNTER — Other Ambulatory Visit: Payer: Self-pay

## 2023-06-04 NOTE — Progress Notes (Signed)
Chart reviewed with Dr Fransisco Beau, [place] for [facility].

## 2023-06-10 ENCOUNTER — Ambulatory Visit (HOSPITAL_BASED_OUTPATIENT_CLINIC_OR_DEPARTMENT_OTHER): Payer: Medicaid Other | Admitting: Anesthesiology

## 2023-06-10 ENCOUNTER — Encounter (HOSPITAL_BASED_OUTPATIENT_CLINIC_OR_DEPARTMENT_OTHER)
Admission: RE | Disposition: A | Discharge status: Home / Self Care | Payer: Self-pay | Source: Home / Self Care | Attending: Otolaryngology

## 2023-06-10 ENCOUNTER — Ambulatory Visit (HOSPITAL_BASED_OUTPATIENT_CLINIC_OR_DEPARTMENT_OTHER)
Admission: RE | Admit: 2023-06-10 | Discharge: 2023-06-10 | Disposition: A | Discharge status: Home / Self Care | Payer: Medicaid Other | Attending: Otolaryngology | Admitting: Otolaryngology

## 2023-06-10 ENCOUNTER — Encounter (HOSPITAL_BASED_OUTPATIENT_CLINIC_OR_DEPARTMENT_OTHER): Payer: Self-pay | Admitting: Otolaryngology

## 2023-06-10 ENCOUNTER — Other Ambulatory Visit: Payer: Self-pay

## 2023-06-10 DIAGNOSIS — J342 Deviated nasal septum: Secondary | ICD-10-CM

## 2023-06-10 DIAGNOSIS — J3489 Other specified disorders of nose and nasal sinuses: Secondary | ICD-10-CM | POA: Insufficient documentation

## 2023-06-10 DIAGNOSIS — J343 Hypertrophy of nasal turbinates: Secondary | ICD-10-CM | POA: Insufficient documentation

## 2023-06-10 DIAGNOSIS — J310 Chronic rhinitis: Secondary | ICD-10-CM | POA: Insufficient documentation

## 2023-06-10 DIAGNOSIS — Z6841 Body Mass Index (BMI) 40.0 and over, adult: Secondary | ICD-10-CM | POA: Insufficient documentation

## 2023-06-10 DIAGNOSIS — G4730 Sleep apnea, unspecified: Secondary | ICD-10-CM | POA: Insufficient documentation

## 2023-06-10 DIAGNOSIS — Z87891 Personal history of nicotine dependence: Secondary | ICD-10-CM | POA: Insufficient documentation

## 2023-06-10 DIAGNOSIS — Z01818 Encounter for other preprocedural examination: Secondary | ICD-10-CM

## 2023-06-10 HISTORY — DX: Sleep apnea, unspecified: G47.30

## 2023-06-10 HISTORY — DX: Other amnesia: R41.3

## 2023-06-10 HISTORY — PX: NASAL SEPTOPLASTY W/ TURBINOPLASTY: SHX2070

## 2023-06-10 SURGERY — SEPTOPLASTY, NOSE, WITH NASAL TURBINATE REDUCTION
Anesthesia: General | Site: Nose | Laterality: Bilateral

## 2023-06-10 MED ORDER — 0.9 % SODIUM CHLORIDE (POUR BTL) OPTIME
TOPICAL | Status: DC | PRN
  Administered 2023-06-10: 450 mL

## 2023-06-10 MED ORDER — ROCURONIUM BROMIDE 10 MG/ML (PF) SYRINGE
PREFILLED_SYRINGE | INTRAVENOUS | Status: DC | PRN
  Administered 2023-06-10: 50 mg via INTRAVENOUS

## 2023-06-10 MED ORDER — AZITHROMYCIN 500 MG PO TABS: 500.0000 mg | ORAL_TABLET | Freq: Every day | ORAL | 0 refills | Status: AC

## 2023-06-10 MED ORDER — PROPOFOL 10 MG/ML IV BOLUS
INTRAVENOUS | Status: AC
  Filled 2023-06-10: qty 20

## 2023-06-10 MED ORDER — MUPIROCIN 2 % EX OINT
TOPICAL_OINTMENT | CUTANEOUS | Status: DC | PRN
  Administered 2023-06-10: 1 via NASAL

## 2023-06-10 MED ORDER — ACETAMINOPHEN 10 MG/ML IV SOLN
INTRAVENOUS | Status: DC | PRN
  Administered 2023-06-10: 1000 mg via INTRAVENOUS

## 2023-06-10 MED ORDER — OXYCODONE HCL 5 MG PO TABS: 5.0000 mg | ORAL_TABLET | Freq: Once | ORAL | Status: DC | PRN

## 2023-06-10 MED ORDER — OXYMETAZOLINE HCL 0.05 % NA SOLN
NASAL | Status: DC | PRN
  Administered 2023-06-10: 1 via TOPICAL

## 2023-06-10 MED ORDER — ROCURONIUM BROMIDE 10 MG/ML (PF) SYRINGE
PREFILLED_SYRINGE | INTRAVENOUS | Status: AC
  Filled 2023-06-10: qty 30

## 2023-06-10 MED ORDER — SODIUM CHLORIDE 0.9 % IV SOLN: INTRAVENOUS | Status: DC | PRN

## 2023-06-10 MED ORDER — ACETAMINOPHEN 10 MG/ML IV SOLN: 1000.0000 mg | Freq: Once | INTRAVENOUS | Status: DC | PRN

## 2023-06-10 MED ORDER — ACETAMINOPHEN 160 MG/5ML PO SOLN: 1000.0000 mg | Freq: Once | ORAL | Status: DC | PRN

## 2023-06-10 MED ORDER — PROPOFOL 10 MG/ML IV BOLUS
INTRAVENOUS | Status: DC | PRN
  Administered 2023-06-10: 180 mg via INTRAVENOUS

## 2023-06-10 MED ORDER — MIDAZOLAM HCL 2 MG/2ML IJ SOLN
INTRAMUSCULAR | Status: AC
  Filled 2023-06-10: qty 2

## 2023-06-10 MED ORDER — OXYMETAZOLINE HCL 0.05 % NA SOLN
NASAL | Status: AC
  Filled 2023-06-10: qty 30

## 2023-06-10 MED ORDER — ROCURONIUM BROMIDE 10 MG/ML (PF) SYRINGE
PREFILLED_SYRINGE | INTRAVENOUS | Status: AC
  Filled 2023-06-10: qty 10

## 2023-06-10 MED ORDER — DEXAMETHASONE SODIUM PHOSPHATE 10 MG/ML IJ SOLN
INTRAMUSCULAR | Status: AC
  Filled 2023-06-10: qty 1

## 2023-06-10 MED ORDER — DEXAMETHASONE SODIUM PHOSPHATE 10 MG/ML IJ SOLN
INTRAMUSCULAR | Status: DC | PRN
  Administered 2023-06-10: 10 mg via INTRAVENOUS

## 2023-06-10 MED ORDER — FENTANYL CITRATE (PF) 100 MCG/2ML IJ SOLN
25.0000 ug | INTRAMUSCULAR | Status: DC | PRN
Start: 2023-06-10 — End: 2023-06-10

## 2023-06-10 MED ORDER — FENTANYL CITRATE (PF) 250 MCG/5ML IJ SOLN
INTRAMUSCULAR | Status: DC | PRN
  Administered 2023-06-10 (×2): 50 ug via INTRAVENOUS

## 2023-06-10 MED ORDER — MIDAZOLAM HCL 5 MG/5ML IJ SOLN
INTRAMUSCULAR | Status: DC | PRN
  Administered 2023-06-10: 2 mg via INTRAVENOUS

## 2023-06-10 MED ORDER — LIDOCAINE 2% (20 MG/ML) 5 ML SYRINGE
INTRAMUSCULAR | Status: DC | PRN
  Administered 2023-06-10: 100 mg via INTRAVENOUS

## 2023-06-10 MED ORDER — PHENYLEPHRINE 80 MCG/ML (10ML) SYRINGE FOR IV PUSH (FOR BLOOD PRESSURE SUPPORT)
PREFILLED_SYRINGE | INTRAVENOUS | Status: AC
  Filled 2023-06-10: qty 10

## 2023-06-10 MED ORDER — LIDOCAINE-EPINEPHRINE 1 %-1:100000 IJ SOLN
INTRAMUSCULAR | Status: DC | PRN
  Administered 2023-06-10: 5.5 mL

## 2023-06-10 MED ORDER — LIDOCAINE-EPINEPHRINE 1 %-1:100000 IJ SOLN
INTRAMUSCULAR | Status: AC
  Filled 2023-06-10: qty 1

## 2023-06-10 MED ORDER — ONDANSETRON HCL 4 MG/2ML IJ SOLN
INTRAMUSCULAR | Status: DC | PRN
  Administered 2023-06-10: 4 mg via INTRAVENOUS

## 2023-06-10 MED ORDER — OXYCODONE HCL 5 MG/5ML PO SOLN: 5.0000 mg | Freq: Once | ORAL | Status: DC | PRN

## 2023-06-10 MED ORDER — MUPIROCIN 2 % EX OINT
TOPICAL_OINTMENT | CUTANEOUS | Status: AC
  Filled 2023-06-10: qty 22

## 2023-06-10 MED ORDER — ACETAMINOPHEN 500 MG PO TABS: 1000.0000 mg | ORAL_TABLET | Freq: Once | ORAL | Status: DC | PRN

## 2023-06-10 MED ORDER — ONDANSETRON HCL 4 MG/2ML IJ SOLN
INTRAMUSCULAR | Status: AC
Start: 2023-06-10 — End: ?
  Filled 2023-06-10: qty 2

## 2023-06-10 MED ORDER — ACETAMINOPHEN 10 MG/ML IV SOLN
INTRAVENOUS | Status: AC
  Filled 2023-06-10: qty 100

## 2023-06-10 MED ORDER — SUGAMMADEX SODIUM 200 MG/2ML IV SOLN
INTRAVENOUS | Status: DC | PRN
  Administered 2023-06-10: 200 mg via INTRAVENOUS

## 2023-06-10 MED ORDER — LACTATED RINGERS IV SOLN: INTRAVENOUS | Status: DC

## 2023-06-10 MED ORDER — FENTANYL CITRATE (PF) 100 MCG/2ML IJ SOLN
INTRAMUSCULAR | Status: AC
  Filled 2023-06-10: qty 2

## 2023-06-10 MED ORDER — LIDOCAINE 2% (20 MG/ML) 5 ML SYRINGE
INTRAMUSCULAR | Status: AC
Start: 2023-06-10 — End: ?
  Filled 2023-06-10: qty 5

## 2023-06-10 MED ORDER — PHENYLEPHRINE HCL-NACL 20-0.9 MG/250ML-% IV SOLN
INTRAVENOUS | Status: DC | PRN
  Administered 2023-06-10: 100 ug/min via INTRAVENOUS

## 2023-06-10 MED ORDER — CLINDAMYCIN PHOSPHATE 900 MG/50ML IV SOLN
INTRAVENOUS | Status: DC | PRN
  Administered 2023-06-10: 900 mg via INTRAVENOUS

## 2023-06-10 SURGICAL SUPPLY — 32 items
ATTRACTOMAT 16X20 MAGNETIC DRP (DRAPES) IMPLANT
CANISTER SUCT 1200ML W/VALVE (MISCELLANEOUS) ×1 IMPLANT
COAGULATOR SUCT 8FR VV (MISCELLANEOUS) ×1 IMPLANT
DEFOGGER MIRROR 1QT (MISCELLANEOUS) ×1 IMPLANT
DRSG NASOPORE 8CM (GAUZE/BANDAGES/DRESSINGS) IMPLANT
DRSG TELFA 3X8 NADH STRL (GAUZE/BANDAGES/DRESSINGS) IMPLANT
ELECT REM PT RETURN 9FT ADLT (ELECTROSURGICAL) ×1
ELECTRODE REM PT RTRN 9FT ADLT (ELECTROSURGICAL) ×1 IMPLANT
GAUZE SPONGE 2X2 STRL 8-PLY (GAUZE/BANDAGES/DRESSINGS) ×1 IMPLANT
GLOVE BIO SURGEON STRL SZ7.5 (GLOVE) ×1 IMPLANT
GLOVE BIOGEL PI IND STRL 7.0 (GLOVE) IMPLANT
GLOVE ECLIPSE 6.5 STRL STRAW (GLOVE) IMPLANT
GOWN STRL REUS W/ TWL LRG LVL3 (GOWN DISPOSABLE) ×2 IMPLANT
MAT PREVALON FULL STRYKER (MISCELLANEOUS) IMPLANT
NDL HYPO 25X1 1.5 SAFETY (NEEDLE) ×1 IMPLANT
NEEDLE HYPO 25X1 1.5 SAFETY (NEEDLE) ×1
NS IRRIG 1000ML POUR BTL (IV SOLUTION) ×1 IMPLANT
PACK BASIN DAY SURGERY FS (CUSTOM PROCEDURE TRAY) ×1 IMPLANT
PACK ENT DAY SURGERY (CUSTOM PROCEDURE TRAY) ×1 IMPLANT
SLEEVE SCD COMPRESS KNEE MED (STOCKING) IMPLANT
SPIKE FLUID TRANSFER (MISCELLANEOUS) IMPLANT
SPLINT NASAL AIRWAY SILICONE (MISCELLANEOUS) ×1 IMPLANT
SPONGE NEURO XRAY DETECT 1X3 (DISPOSABLE) ×1 IMPLANT
SUT CHROMIC 4 0 P 3 18 (SUTURE) ×1 IMPLANT
SUT CHROMIC 4 0 RB 1X27 (SUTURE) IMPLANT
SUT PLAIN 4 0 ~~LOC~~ 1 (SUTURE) ×1 IMPLANT
SUT PROLENE 3 0 PS 2 (SUTURE) ×1 IMPLANT
SUT VIC AB 4-0 P-3 18XBRD (SUTURE) IMPLANT
TOWEL GREEN STERILE FF (TOWEL DISPOSABLE) ×1 IMPLANT
TUBE SALEM SUMP 12FR 48 (TUBING) IMPLANT
TUBE SALEM SUMP 16F (TUBING) ×1 IMPLANT
YANKAUER SUCT BULB TIP NO VENT (SUCTIONS) ×1 IMPLANT
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
## 2023-06-10 NOTE — Anesthesia Procedure Notes (Signed)
Procedure Name: Intubation Date/Time: 06/10/2023 8:24 AM  Performed by: Roosvelt Harps, CRNAPre-anesthesia Checklist: Patient identified, Emergency Drugs available, Suction available and Patient being monitored Patient Re-evaluated:Patient Re-evaluated prior to induction Oxygen Delivery Method: Circle System Utilized Preoxygenation: Pre-oxygenation with 100% oxygen Induction Type: IV induction Ventilation: Mask ventilation without difficulty Laryngoscope Size: Mac and 4 Grade View: Grade I Tube type: Oral Tube size: 7.5 mm Number of attempts: 1 Airway Equipment and Method: Stylet Placement Confirmation: ETT inserted through vocal cords under direct vision, positive ETCO2 and breath sounds checked- equal and bilateral Secured at: 23 cm Tube secured with: Tape Dental Injury: Teeth and Oropharynx as per pre-operative assessment

## 2023-06-10 NOTE — H&P (Signed)
Cc: Chronic nasal obstruction  HPI: The patient is a 57 year old male who returns today for his follow-up evaluation.  The patient was previously seen for chronic nasal obstruction.  At his last visit, he was noted to have nasal mucosal congestion, nasal septal deviation, and bilateral inferior turbinate hypertrophy.  The patient was treated with Flonase nasal spray and allergy medications.  Despite the treatment, he continues to be symptomatic.  He returns today complaining of severe difficulty breathing through his nose.  He is interested in more definitive treatment.   Exam: General: Communicates without difficulty, well nourished, no acute distress. Head: Normocephalic, no evidence injury, no tenderness, facial buttresses intact without stepoff. Face/sinus: No tenderness to palpation and percussion. Facial movement is normal and symmetric. Eyes: PERRL, EOMI. No scleral icterus, conjunctivae clear. Neuro: CN II exam reveals vision grossly intact. No nystagmus at any point of gaze. Auricles: Intact without lesions. EAC: Both tympanic membranes are noted to be intact and mobile. However, the right malleus is noted to be mildly positioned and protruding laterally. No middle ear effusion is noted. Nose: External evaluation reveals normal support and skin without lesions. Dorsum is intact. Anterior rhinoscopy reveals congested mucosa over anterior aspect of inferior turbinates and deviated septum. No purulence noted. Oral: Oral cavity and oropharynx are intact, symmetric, without erythema or edema. Mucosa is moist without lesions. Neck: Full range of motion without pain. There is no significant lymphadenopathy. No masses palpable. Thyroid bed within normal limits to palpation. Parotid glands and submandibular glands equal bilaterally without mass. Trachea is midline. Neuro: CN 2-12 grossly intact. Gait normal.    Assessment: 1.  Chronic rhinitis with nasal mucosal congestion, nasal septal deviation, and  bilateral inferior turbinate hypertrophy.  More than 95% of his nasal passageways are obstructed bilaterally. 2.  No acute infection is noted today.   Plan: 1.  The physical exam findings are reviewed with the patient. 2.  Continue with Flonase nasal spray daily. 3.  In light of his persistent nasal obstruction, he may benefit from surgical intervention with septoplasty and turbinate reduction.  The risk, benefits, and details of the procedures are reviewed.  Questions are invited and answered.  The patient would like to proceed with the procedures.

## 2023-06-10 NOTE — Anesthesia Postprocedure Evaluation (Signed)
Anesthesia Post Note  Patient: Richard Cardenas  Procedure(s) Performed: NASAL SEPTOPLASTY WITH TURBINATE REDUCTION (Bilateral: Nose)     Patient location during evaluation: PACU Anesthesia Type: General Level of consciousness: awake and alert Pain management: pain level controlled Vital Signs Assessment: post-procedure vital signs reviewed and stable Respiratory status: spontaneous breathing, nonlabored ventilation and respiratory function stable Cardiovascular status: blood pressure returned to baseline and stable Postop Assessment: no apparent nausea or vomiting Anesthetic complications: no   No notable events documented.  Last Vitals:  Vitals:   06/10/23 0945 06/10/23 1000  BP: (!) 141/82 119/83  Pulse: 80 70  Resp: 12 10  Temp:    SpO2: 97% 95%    Last Pain:  Vitals:   06/10/23 1000  TempSrc:   PainSc: 0-No pain                 Shayleen Eppinger

## 2023-06-10 NOTE — Op Note (Signed)
DATE OF PROCEDURE: 06/10/2023  OPERATIVE REPORT   SURGEON: Newman Pies, MD   PREOPERATIVE DIAGNOSES:  1. Severe nasal septal deviation.  2. Bilateral inferior turbinate hypertrophy.  3. Chronic nasal obstruction.  POSTOPERATIVE DIAGNOSES:  1. Severe nasal septal deviation.  2. Bilateral inferior turbinate hypertrophy.  3. Chronic nasal obstruction.  PROCEDURE PERFORMED:  1. Septoplasty.  2. Bilateral partial inferior turbinate resection.   ANESTHESIA: General endotracheal tube anesthesia.   COMPLICATIONS: None.   ESTIMATED BLOOD LOSS: 100 mL.   INDICATION FOR PROCEDURE: Richard Cardenas is a 57 y.o. male with a history of chronic nasal obstruction. The patient was treated with antihistamine, decongestant, and steroid nasal sprays. However, the patient continued to be symptomatic. On examination, the patient was noted to have bilateral severe inferior turbinate hypertrophy and significant nasal septal deviation, causing significant nasal obstruction. Based on the above findings, the decision was made for the patient to undergo the above-stated procedures. The risks, benefits, alternatives, and details of the procedures were discussed with the patient. Questions were invited and answered. Informed consent was obtained.   DESCRIPTION OF PROCEDURE: The patient was taken to the operating room and placed supine on the operating table. General endotracheal tube anesthesia was administered by the anesthesiologist. The patient was positioned, and prepped and draped in the standard fashion for nasal surgery. Pledgets soaked with Afrin were placed in both nasal cavities for decongestion. The pledgets were subsequently removed.   Examination of the nasal cavity revealed a severe nasal septal deviation. 1% lidocaine with 1:100,000 epinephrine was injected onto the nasal septum bilaterally. A hemitransfixion incision was made on the left side. The mucosal flap was carefully elevated on the left side. A  cartilaginous incision was made 1 cm superior to the caudal margin of the nasal septum. Mucosal flap was also elevated on the right side in the similar fashion. It should be noted that due to the severe septal deviation, the deviated portion of the cartilaginous and bony septum had to be removed in piecemeal fashion. Once the deviated portions were removed, a straight midline septum was achieved. The septum was then quilted with 4-0 plain gut sutures. The hemitransfixion incision was closed with interrupted 4-0 chromic sutures.   The inferior one half of both hypertrophied inferior turbinate was crossclamped with a Kelly clamp. The inferior one half of each inferior turbinate was then resected with a pair of cross cutting scissors. Hemostasis was achieved with a suction cautery device. Doyle splints were applied to the nasal septum.  The care of the patient was turned over to the anesthesiologist. The patient was awakened from anesthesia without difficulty. The patient was extubated and transferred to the recovery room in good condition.   OPERATIVE FINDINGS: Severe nasal septal deviation and bilateral inferior turbinate hypertrophy.   SPECIMEN: None.   FOLLOWUP CARE: The patient be discharged home once he is awake and alert.  The patient will follow up in my office in 3 days for splint removal.   Richard Zidek Philomena Doheny, MD

## 2023-06-10 NOTE — Transfer of Care (Signed)
Immediate Anesthesia Transfer of Care Note  Patient: Richard Cardenas  Procedure(s) Performed: NASAL SEPTOPLASTY WITH TURBINATE REDUCTION (Bilateral: Nose)  Patient Location: PACU  Anesthesia Type:General  Level of Consciousness: awake  Airway & Oxygen Therapy: Patient Spontanous Breathing and Patient connected to face mask oxygen  Post-op Assessment: Report given to RN and Post -op Vital signs reviewed and stable  Post vital signs: Reviewed and stable  Last Vitals:  Vitals Value Taken Time  BP 163/99 06/10/23 0924  Temp    Pulse 89 06/10/23 0925  Resp 15 06/10/23 0925  SpO2 96 % 06/10/23 0925  Vitals shown include unfiled device data.  Last Pain:  Vitals:   06/10/23 0655  TempSrc: Oral  PainSc: 1          Complications: No notable events documented.

## 2023-06-10 NOTE — Discharge Instructions (Addendum)
You may have Tylenol again after 3pm today, if needed.  ---------------  POSTOPERATIVE INSTRUCTIONS FOR PATIENTS HAVING NASAL OR SINUS OPERATIONS ACTIVITY: Restrict activity at home for the first two days, resting as much as possible. Light activity is best. You may usually return to work within a week. You should refrain from nose blowing, strenuous activity, or heavy lifting greater than 20lbs for a total of one week after your operation.  If sneezing cannot be avoided, sneeze with your mouth open. DISCOMFORT: You may experience a dull headache and pressure along with nasal congestion and discharge. These symptoms may be worse during the first week after the operation but may last as long as two to four weeks.  Please take Tylenol or the pain medication that has been prescribed for you. Do not take aspirin or aspirin containing medications since they may cause bleeding.  You may experience symptoms of post nasal drainage, nasal congestion, headaches and fatigue for two or three months after your operation.  BLEEDING: You may have some blood tinged nasal drainage for approximately two weeks after the operation.  The discharge will be worse for the first week.  Please call our office at [phone] or go to the nearest hospital emergency room if you experience any of the following: heavy, bright red blood from your nose or mouth that lasts longer than 15 minutes or coughing up or vomiting bright red blood or blood clots. GENERAL CONSIDERATIONS: A gauze dressing will be placed on your upper lip to absorb any drainage after the operation. You may need to change this several times a day.  If you do not have very much drainage, you may remove the dressing.  Remember that you may gently wipe your nose with a tissue and sniff in, but DO NOT blow your nose. Please keep all of your postoperative appointments.  Your final results after the operation will depend on proper follow-up.  The initial visit is usually 2  to 5 days after the operation.  During this visit, the remaining nasal packing and internal septal splints will be removed.  Your nasal and sinus cavities will be cleaned.  During the second visit, your nasal and sinus cavities will be cleaned again. Have someone drive you to your first two postoperative appointments.  How you care for your nose after the operation will influence the results that you obtain.  You should follow all directions, take your medication as prescribed, and call our office [phone] with any problems or questions. You may be more comfortable sleeping with your head elevated on two pillows. Do not take any medications that we have not prescribed or recommended. WARNING SIGNS: if any of the following should occur, please call our office: Persistent fever greater than 102F. Persistent vomiting. Severe and constant pain that is not relieved by prescribed pain medication. Trauma to the nose. Rash or unusual side effects from any medicines.    Post Anesthesia Home Care Instructions  Activity: Get plenty of rest for the remainder of the day. A responsible individual must stay with you for 24 hours following the procedure.  For the next 24 hours, DO NOT: -Drive a car -Advertising copywriter -Drink alcoholic beverages -Take any medication unless instructed by your physician -Make any legal decisions or sign important papers.  Meals: Start with liquid foods such as gelatin or soup. Progress to regular foods as tolerated. Avoid greasy, spicy, heavy foods. If nausea and/or vomiting occur, drink only clear liquids until the nausea and/or vomiting subsides. Call  your physician if vomiting continues.  Special Instructions/Symptoms: Your throat may feel dry or sore from the anesthesia or the breathing tube placed in your throat during surgery. If this causes discomfort, gargle with warm salt water. The discomfort should disappear within 24 hours.  If you had a scopolamine patch  placed behind your ear for the management of post- operative nausea and/or vomiting:  1. The medication in the patch is effective for 72 hours, after which it should be removed.  Wrap patch in a tissue and discard in the trash. Wash hands thoroughly with soap and water. 2. You may remove the patch earlier than 72 hours if you experience unpleasant side effects which may include dry mouth, dizziness or visual disturbances. 3. Avoid touching the patch. Wash your hands with soap and water after contact with the patch.

## 2023-06-11 ENCOUNTER — Encounter (HOSPITAL_BASED_OUTPATIENT_CLINIC_OR_DEPARTMENT_OTHER): Payer: Self-pay | Admitting: Otolaryngology

## 2023-06-13 ENCOUNTER — Encounter (INDEPENDENT_AMBULATORY_CARE_PROVIDER_SITE_OTHER): Payer: Self-pay

## 2023-06-13 ENCOUNTER — Ambulatory Visit (INDEPENDENT_AMBULATORY_CARE_PROVIDER_SITE_OTHER): Payer: Medicaid Other

## 2023-06-13 VITALS — Ht 75.0 in | Wt 356.0 lb

## 2023-06-13 DIAGNOSIS — Z09 Encounter for follow-up examination after completed treatment for conditions other than malignant neoplasm: Secondary | ICD-10-CM

## 2023-06-13 DIAGNOSIS — J310 Chronic rhinitis: Secondary | ICD-10-CM

## 2023-06-13 NOTE — Progress Notes (Signed)
Patient ID: Richard Cardenas, male   DOB: October 10, 1965, 57 y.o.   MRN: [id number]  Ralph Leyden splints removed. Septum and turbinates are healing well.   Both [place] debrided.  Nasal saline irrigation.  Recheck in 3 weeks.

## 2023-06-28 ENCOUNTER — Encounter (HOSPITAL_COMMUNITY): Payer: Self-pay

## 2023-06-28 ENCOUNTER — Other Ambulatory Visit: Payer: Self-pay

## 2023-06-28 ENCOUNTER — Emergency Department (HOSPITAL_COMMUNITY)
Admission: EM | Admit: 2023-06-28 | Discharge: 2023-06-28 | Disposition: A | Discharge status: Home / Self Care | Payer: Medicaid Other | Attending: Emergency Medicine | Admitting: Emergency Medicine

## 2023-06-28 ENCOUNTER — Telehealth (INDEPENDENT_AMBULATORY_CARE_PROVIDER_SITE_OTHER): Payer: Self-pay

## 2023-06-28 DIAGNOSIS — R040 Epistaxis: Secondary | ICD-10-CM | POA: Diagnosis present

## 2023-06-28 NOTE — ED Triage Notes (Signed)
 Pt c/o epistaxis since this am; same yesterday, resolved on own; pt had nasal surgery 12/16

## 2023-06-28 NOTE — Telephone Encounter (Signed)
 Patient wife called and said patient is bleeding really bad from surgery Dr. Karis did. Advised to go to ER for the bleeding to get controlled since Dr. Karis is not here until Monday. Told them we had a on call ENT Dr. Roark to see patient. Patient wife sounded frantic and said patient was choking on his blood.
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
## 2023-06-28 NOTE — ED Provider Notes (Signed)
 [facility] [facility] Provider Note   CSN: [id number] Arrival date & time: 06/28/23  1053     History  Chief Complaint  Patient presents with   Epistaxis   Post-op Problem    Richard Cardenas is a 58 y.o. male with past medical history of seizures, sleep apnea, nasal septoplasty reporting to emergency room with left sided epistaxis.  Patient reports he had a small nosebleed yesterday that resolved spontaneously.  Reports today he woke up with worsening nosebleed, which has again open had to stop with direct pressure.  He is Status post nose surgery 06/10/23.  Bleeding has stopped spontaneously.  Patient contacted ENT office.  ENT office recommended patient come into ER for further eval. patient denies any dizziness, lightheadedness, excessive blood loss.  He is not on blood thinners.  No recent injury.  Patient does report he had nasal drainage yesterday and gently blew his nose which he htinks started this.    Epistaxis      Home Medications Prior to Admission medications   Medication Sig Start Date End Date Taking? Authorizing Provider  acetaminophen  (TYLENOL ) 650 MG CR tablet Take 650 mg by mouth every 8 (eight) hours as needed for pain.    [provider]  cetirizine (ZYRTEC) 10 MG tablet Take 10 mg by mouth daily.    [provider]  Cholecalciferol (D3 2000 PO) Take 1 capsule by mouth daily.    [provider]  Hyprom-Naphaz-Polysorb-Zn Sulf (CLEAR EYES COMPLETE) SOLN Apply to eye.    [provider]  levETIRAcetam  (KEPPRA ) 1000 MG tablet Take 1 tablet (1,000 mg total) by mouth 2 (two) times daily. 01/14/23   Gayland Lauraine PARAS, NP  Multiple Vitamin (MULTIVITAMIN) tablet Take 1 tablet by mouth daily.    [provider]  tamsulosin (FLOMAX) 0.4 MG CAPS capsule Take 0.4 mg by mouth daily. 06/13/17   [provider]  testosterone  cypionate (DEPOTESTOSTERONE CYPIONATE) 200 MG/ML injection Inject 1  mL (200 mg total) into the muscle once. Every 2 weeks 01/29/17 06/10/23  Melvenia Shuck B, PA-C  UNABLE TO FIND Take 1 capsule by mouth daily. Med Name: memory support supplement    [provider]  vitamin B-12 (CYANOCOBALAMIN) 1000 MCG tablet Take 1,000 mcg by mouth daily.    [provider]  vitamin C (ASCORBIC ACID) 500 MG tablet Take 500 mg by mouth daily.    [provider]      Allergies    Ampicillin and Penicillins    Review of Systems   Review of Systems  HENT:  Positive for nosebleeds.     Physical Exam Updated Vital Signs BP (!) 156/97 (BP Location: Left Arm)   Pulse 79   Temp 98.3 F (36.8 C)   Resp 19   SpO2 97%  Physical Exam Vitals and nursing note reviewed.  Constitutional:      General: He is not in acute distress.    Appearance: He is not toxic-appearing.  HENT:     Head: Normocephalic and atraumatic.     Mouth/Throat:     Comments: Small amount of dried blood in posterior pharynx.  No active bleeding. Nare with dried blood, no active bleeding. No sign of respiratory distress, no difficulty breathing.  Eyes:     General: No scleral icterus.    Conjunctiva/sclera: Conjunctivae normal.  Cardiovascular:     Rate and Rhythm: Normal rate and regular rhythm.     Pulses: Normal pulses.  Heart sounds: Normal heart sounds.  Pulmonary:     Effort: Pulmonary effort is normal. No respiratory distress.     Breath sounds: Normal breath sounds.  Abdominal:     General: Abdomen is flat. Bowel sounds are normal.     Palpations: Abdomen is soft.     Tenderness: There is no abdominal tenderness.  Musculoskeletal:     Right lower leg: No edema.     Left lower leg: No edema.  Skin:    General: Skin is warm and dry.     Findings: No lesion.  Neurological:     General: No focal deficit present.     Mental Status: He is alert and oriented to person, place, and time. Mental status is at baseline.     ED Results / Procedures / Treatments    Labs (all labs ordered are listed, but only abnormal results are displayed) Labs Reviewed - No data to display  EKG None  Radiology No results found.  Procedures Procedures    Medications Ordered in ED Medications - No data to display  ED Course/ Medical Decision Making/ A&P                                 Medical Decision Making  This patient presents to the ED for concern of left nose bleed, this involves an extensive number of treatment options, and is a complaint that carries with it a high risk of complications and morbidity.  The differential diagnosis includes nasal trauma, bleeding disorder, postop complication   Co morbidities that complicate the patient evaluation  Nasal procedure, sleep apnea   Additional history obtained:  Additional history obtained from 06/27/22 phone call to ENT   Consultations Obtained:  I requested consultation with the Dr Roark,  and discussed lab and imaging findings as well as pertinent plan - they recommend: d/c f/u OP Thursday    Problem List / ED Course / Critical interventions / Medication management  Patient reporting to emergency room with nosebleed.  He is status post the procedure in mid December.  On arrival he well-appearing hemodynamically stable.  Patient is not on blood thinner no recent injury. I do not feel imaging and labs are needed at this time. Patient did call ENT office who recommended coming here since he were not able to stop bleeding at home.  On arrival bleeding has stopped.  He is well-appearing.  Dr. Roark agrees to see patient who evaluated him, recommends no further intervention.  Patient does not need packing at this time as patient's bleeding is spontaneously improved.  Follow-up with ENT next week or return to emergency room if bleeding returns. Patient agrees and understands plan. Stable for discharge.  No medications ordered.  I have reviewed the patients home medicines and have made adjustments as  needed   Plan  F/u w/ PCP in 2-3d to ensure resolution of sx.  Patient was given return precautions. Patient stable for discharge at this time.  Patient educated on sx/dx and verbalized understanding of plan. Return to ER w/ new or worsening sx.          Final Clinical Impression(s) / ED Diagnoses Final diagnoses:  Epistaxis    Rx / DC Orders ED Discharge Orders     None         Shermon Warren SAILOR, PA-C 06/29/23 9281    Armenta Canning, MD 07/03/23 [phone]

## 2023-06-28 NOTE — ED Notes (Addendum)
 Pt spouse unsure of the process for being seen in ER. RN educated pt on the process. Spouse unsure if pt needs to stay since bleeding is controlled; however, she is concerned it may start bleeding again. RN informed pt we encourage them to stay to be seen by a provider because the process will start over if she leaves and return. Pt and spouse verbalized understanding and will wait to be seen.

## 2023-06-28 NOTE — ED Provider Triage Note (Signed)
 Emergency Medicine Provider Triage Evaluation Note  Mehki Klumpp , a 58 y.o. male  was evaluated in triage.  Pt complains of nose bleed.  Review of Systems  Positive: Nose bleed Negative: HA, weakness, fall  Physical Exam  BP (!) 156/97 (BP Location: Left Arm)   Pulse 79   Temp 98.3 F (36.8 C)   Resp 19   SpO2 97%  Gen:   Awake, no distress   Resp:  Normal effort  MSK:   Moves extremities without difficulty  Other:    Medical Decision Making  Medically screening exam initiated at 12:26 PM.  Appropriate orders placed.  Redell Katrinka Kerns was informed that the remainder of the evaluation will be completed by another provider, this initial triage assessment does not replace that evaluation, and the importance of remaining in the ED until their evaluation is complete.  Nasal septoplasty w/ turbinoplasty 06/10/23. Nose bleed spontaneously stopped here.  Patient hemodynamically stable well-appearing.   Shermon Warren SAILOR, PA-C 06/28/23 1247

## 2023-06-28 NOTE — ED Notes (Signed)
 Dr Jearld Fenton seeing patient in triage 5

## 2023-07-04 ENCOUNTER — Ambulatory Visit (INDEPENDENT_AMBULATORY_CARE_PROVIDER_SITE_OTHER): Payer: Medicaid Other | Admitting: Otolaryngology

## 2023-07-04 VITALS — HR 75 | Ht 75.0 in | Wt 365.0 lb

## 2023-07-04 DIAGNOSIS — J310 Chronic rhinitis: Secondary | ICD-10-CM

## 2023-07-04 DIAGNOSIS — Z09 Encounter for follow-up examination after completed treatment for conditions other than malignant neoplasm: Secondary | ICD-10-CM

## 2023-07-04 DIAGNOSIS — J342 Deviated nasal septum: Secondary | ICD-10-CM

## 2023-07-04 NOTE — Progress Notes (Signed)
 Patient ID: Richard Cardenas, male   DOB: July 23, 1965, 58 y.o.   MRN: [id number]  Septum and turbinates are healing well.  Had 1 episode of nasal bleeding last week.  Spontaneously resolved.   Both [place] debrided.   Nasal saline irrigation as needed.   Recheck in 6 months.

## 2023-07-17 ENCOUNTER — Encounter: Payer: Self-pay | Admitting: Podiatry

## 2023-07-17 ENCOUNTER — Ambulatory Visit: Payer: Medicaid Other | Admitting: Podiatry

## 2023-07-17 VITALS — Ht 75.0 in | Wt 365.0 lb

## 2023-07-17 DIAGNOSIS — B351 Tinea unguium: Secondary | ICD-10-CM | POA: Diagnosis not present

## 2023-07-17 DIAGNOSIS — L608 Other nail disorders: Secondary | ICD-10-CM | POA: Diagnosis not present

## 2023-07-17 DIAGNOSIS — M79674 Pain in right toe(s): Secondary | ICD-10-CM

## 2023-07-17 DIAGNOSIS — M79675 Pain in left toe(s): Secondary | ICD-10-CM

## 2023-07-17 NOTE — Progress Notes (Signed)
This patient presents to the office with chief complaint of long thick painful big toe nails.  Patient says the nails are painful walking and wearing shoes.  This patient is unable to self treat.  This patient is unable to trim his nails since he is unable to reach his nails.  he presents to the office for preventative foot care services.  General Appearance  Alert, conversant and in no acute stress.  Vascular  Dorsalis pedis and posterior tibial  pulses are palpable  bilaterally.  Capillary return is within normal limits  bilaterally. Temperature is within normal limits  bilaterally.  Neurologic  Senn-Weinstein monofilament wire test within normal limits  bilaterally. Muscle power within normal limits bilaterally.  Nails Thick disfigured discolored nails with subungual debris  hallux nails  bilaterally. No evidence of bacterial infection or drainage bilaterally.  Orthopedic  No limitations of motion  feet .  No crepitus or effusions noted.  No bony pathology or digital deformities noted.  Skin  normotropic skin with no porokeratosis noted bilaterally.  No signs of infections or ulcers noted.     Onychomycosis  Nails  B/L.  Pain in right toes  Pain in left toes  Debridement of nails both feet followed trimming the nails with dremel tool.    RTC 3 months.   Helane Gunther DPM

## 2023-10-16 ENCOUNTER — Ambulatory Visit (INDEPENDENT_AMBULATORY_CARE_PROVIDER_SITE_OTHER): Payer: Medicaid Other | Admitting: Podiatry

## 2023-10-16 ENCOUNTER — Telehealth: Payer: Self-pay

## 2023-10-16 ENCOUNTER — Encounter: Payer: Self-pay | Admitting: Podiatry

## 2023-10-16 DIAGNOSIS — M79674 Pain in right toe(s): Secondary | ICD-10-CM

## 2023-10-16 DIAGNOSIS — M79675 Pain in left toe(s): Secondary | ICD-10-CM

## 2023-10-16 DIAGNOSIS — L608 Other nail disorders: Secondary | ICD-10-CM

## 2023-10-16 DIAGNOSIS — B351 Tinea unguium: Secondary | ICD-10-CM

## 2023-10-16 NOTE — Progress Notes (Signed)
This patient presents to the office with chief complaint of long thick painful big toe nails.  Patient says the nails are painful walking and wearing shoes.  This patient is unable to self treat.  This patient is unable to trim his nails since he is unable to reach his nails.  he presents to the office for preventative foot care services.  General Appearance  Alert, conversant and in no acute stress.  Vascular  Dorsalis pedis and posterior tibial  pulses are palpable  bilaterally.  Capillary return is within normal limits  bilaterally. Temperature is within normal limits  bilaterally.  Neurologic  Senn-Weinstein monofilament wire test within normal limits  bilaterally. Muscle power within normal limits bilaterally.  Nails Thick disfigured discolored nails with subungual debris  hallux nails  bilaterally. No evidence of bacterial infection or drainage bilaterally.  Orthopedic  No limitations of motion  feet .  No crepitus or effusions noted.  No bony pathology or digital deformities noted.  Skin  normotropic skin with no porokeratosis noted bilaterally.  No signs of infections or ulcers noted.     Onychomycosis  Nails  B/L.  Pain in right toes  Pain in left toes  Debridement of nails both feet followed trimming the nails with dremel tool.    RTC 3 months.   Helane Gunther DPM

## 2023-10-16 NOTE — Telephone Encounter (Signed)
 Surgical clearance for colon polyps faxed to [facility] gastro
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
## 2023-11-21 ENCOUNTER — Encounter: Payer: Self-pay | Admitting: Neurology

## 2023-11-21 ENCOUNTER — Ambulatory Visit: Payer: Medicare Other | Admitting: Neurology

## 2023-11-21 VITALS — BP 141/76 | Ht 75.0 in | Wt 359.5 lb

## 2023-11-21 DIAGNOSIS — G40909 Epilepsy, unspecified, not intractable, without status epilepticus: Secondary | ICD-10-CM

## 2023-11-21 DIAGNOSIS — Z87820 Personal history of traumatic brain injury: Secondary | ICD-10-CM | POA: Diagnosis not present

## 2023-11-21 MED ORDER — LEVETIRACETAM 1000 MG PO TABS: 1000.0000 mg | ORAL_TABLET | Freq: Two times a day (BID) | ORAL | 4 refills | Status: AC

## 2023-11-21 NOTE — Patient Instructions (Signed)
 Great to see you today.  Will continue Keppra  for seizure prevention.  Please continue routine follow-up with your primary care.  Call for any seizure activity.  Follow-up in 1 year.  Thanks!!

## 2024-01-01 ENCOUNTER — Ambulatory Visit (INDEPENDENT_AMBULATORY_CARE_PROVIDER_SITE_OTHER): Payer: Medicaid Other | Admitting: Otolaryngology

## 2024-01-01 ENCOUNTER — Encounter (INDEPENDENT_AMBULATORY_CARE_PROVIDER_SITE_OTHER): Payer: Self-pay | Admitting: Otolaryngology

## 2024-01-01 VITALS — BP 145/79 | HR 70

## 2024-01-01 DIAGNOSIS — R0981 Nasal congestion: Secondary | ICD-10-CM

## 2024-01-01 DIAGNOSIS — J310 Chronic rhinitis: Secondary | ICD-10-CM

## 2024-01-01 DIAGNOSIS — J343 Hypertrophy of nasal turbinates: Secondary | ICD-10-CM

## 2024-01-02 NOTE — Progress Notes (Signed)
 Patient ID: Richard Cardenas, male   DOB: 1965-09-21, 58 y.o.   MRN: [id number]  Follow-up: Chronic nasal obstruction  HPI: The patient is a 58 year old male who returns today for his follow-up evaluation.  The patient was previously seen for his chronic nasal obstruction.  He underwent septoplasty and turbinate reduction surgery in December 2024.  The patient returns today reporting significant improvement in his nasal breathing.  He has noted only mild nasal congestion during the allergy season.  Currently he denies any facial pain, fever, or visual change.  Exam: General: Communicates without difficulty, well nourished, no acute distress. Head: Normocephalic, no evidence injury, no tenderness, facial buttresses intact without stepoff. Face/sinus: No tenderness to palpation and percussion. Facial movement is normal and symmetric. Eyes: PERRL, EOMI. No scleral icterus, conjunctivae clear. Neuro: CN II exam reveals vision grossly intact.  No nystagmus at any point of gaze. Ears: Auricles well formed without lesions.  Ear canals are intact without mass or lesion.  No erythema or edema is appreciated.  The TMs are intact without fluid. Nose: External evaluation reveals normal support and skin without lesions.  Dorsum is intact.  Anterior rhinoscopy reveals congested mucosa over anterior aspect of inferior turbinates and intact septum.  No purulence noted. Oral:  Oral cavity and oropharynx are intact, symmetric, without erythema or edema.  Mucosa is moist without lesions. Neck: Full range of motion without pain.  There is no significant lymphadenopathy.  No masses palpable.  Thyroid bed within normal limits to palpation.  Parotid glands and submandibular glands equal bilaterally without mass.  Trachea is midline. Neuro:  CN 2-12 grossly intact.   Assessment: 1.  Chronic rhinitis with mild nasal mucosal congestion. 2.  His septum and turbinates are well-healed.  His nasal passageways are patent  bilaterally.  Plan: 1.  The physical exam findings are reviewed with the patient. 2.  Nasal saline irrigation as needed. 3.  The patient is encouraged to call with any questions or concerns.

## 2024-01-15 ENCOUNTER — Encounter: Payer: Self-pay | Admitting: Podiatry

## 2024-01-15 ENCOUNTER — Ambulatory Visit: Admitting: Podiatry

## 2024-01-15 DIAGNOSIS — B351 Tinea unguium: Secondary | ICD-10-CM

## 2024-01-15 DIAGNOSIS — M79675 Pain in left toe(s): Secondary | ICD-10-CM | POA: Diagnosis not present

## 2024-01-15 DIAGNOSIS — M79674 Pain in right toe(s): Secondary | ICD-10-CM

## 2024-01-15 DIAGNOSIS — L608 Other nail disorders: Secondary | ICD-10-CM

## 2024-01-15 NOTE — Progress Notes (Signed)
 This patient presents to the office with chief complaint of long thick painful big toe nails.  Patient says the nails are painful walking and wearing shoes.  This patient is unable to self treat.  This patient is unable to trim his nails since he is unable to reach his nails.  he presents to the office for preventative foot care services.  General Appearance  Alert, conversant and in no acute stress.  Vascular  Dorsalis pedis and posterior tibial  pulses are palpable  bilaterally.  Capillary return is within normal limits  bilaterally. Temperature is within normal limits  bilaterally.  Neurologic  Senn-Weinstein monofilament wire test within normal limits  bilaterally. Muscle power within normal limits bilaterally.  Nails Thick disfigured discolored nails with subungual debris  hallux nails  bilaterally. No evidence of bacterial infection or drainage bilaterally.  Orthopedic  No limitations of motion  feet .  No crepitus or effusions noted.  No bony pathology or digital deformities noted.  Skin  normotropic skin with no porokeratosis noted bilaterally.  No signs of infections or ulcers noted.   Asymptomatic skin lesion dorsum of right foot.  Onychomycosis  Nails  B/L.  Pain in right toes  Pain in left toes  Debridement of nails both feet followed trimming the nails with dremel tool.    RTC 3 months.   Cordella Bold DPM

## 2024-01-24 DIAGNOSIS — M2550 Pain in unspecified joint: Secondary | ICD-10-CM | POA: Diagnosis not present

## 2024-01-24 DIAGNOSIS — M109 Gout, unspecified: Secondary | ICD-10-CM | POA: Diagnosis not present

## 2024-01-31 DIAGNOSIS — E785 Hyperlipidemia, unspecified: Secondary | ICD-10-CM | POA: Diagnosis not present

## 2024-01-31 DIAGNOSIS — E559 Vitamin D deficiency, unspecified: Secondary | ICD-10-CM | POA: Diagnosis not present

## 2024-01-31 DIAGNOSIS — M109 Gout, unspecified: Secondary | ICD-10-CM | POA: Diagnosis not present

## 2024-02-18 DIAGNOSIS — H90A31 Mixed conductive and sensorineural hearing loss, unilateral, right ear with restricted hearing on the contralateral side: Secondary | ICD-10-CM | POA: Diagnosis not present

## 2024-02-18 DIAGNOSIS — H6123 Impacted cerumen, bilateral: Secondary | ICD-10-CM | POA: Diagnosis not present

## 2024-02-18 DIAGNOSIS — H9011 Conductive hearing loss, unilateral, right ear, with unrestricted hearing on the contralateral side: Secondary | ICD-10-CM | POA: Diagnosis not present

## 2024-03-02 ENCOUNTER — Ambulatory Visit: Admitting: Occupational Therapy

## 2024-03-16 DIAGNOSIS — Z713 Dietary counseling and surveillance: Secondary | ICD-10-CM | POA: Diagnosis not present

## 2024-03-16 DIAGNOSIS — E785 Hyperlipidemia, unspecified: Secondary | ICD-10-CM | POA: Diagnosis not present

## 2024-03-16 DIAGNOSIS — M109 Gout, unspecified: Secondary | ICD-10-CM | POA: Diagnosis not present

## 2024-03-31 DIAGNOSIS — M109 Gout, unspecified: Secondary | ICD-10-CM | POA: Diagnosis not present

## 2024-03-31 DIAGNOSIS — G629 Polyneuropathy, unspecified: Secondary | ICD-10-CM | POA: Diagnosis not present

## 2024-04-03 DIAGNOSIS — M79671 Pain in right foot: Secondary | ICD-10-CM | POA: Diagnosis not present

## 2024-04-03 DIAGNOSIS — R5383 Other fatigue: Secondary | ICD-10-CM | POA: Diagnosis not present

## 2024-04-03 DIAGNOSIS — M109 Gout, unspecified: Secondary | ICD-10-CM | POA: Diagnosis not present

## 2024-04-03 DIAGNOSIS — M1991 Primary osteoarthritis, unspecified site: Secondary | ICD-10-CM | POA: Diagnosis not present

## 2024-04-03 DIAGNOSIS — M79672 Pain in left foot: Secondary | ICD-10-CM | POA: Diagnosis not present

## 2024-04-07 DIAGNOSIS — M25561 Pain in right knee: Secondary | ICD-10-CM | POA: Diagnosis not present

## 2024-04-07 DIAGNOSIS — M25562 Pain in left knee: Secondary | ICD-10-CM | POA: Diagnosis not present

## 2024-04-10 DIAGNOSIS — M25562 Pain in left knee: Secondary | ICD-10-CM | POA: Diagnosis not present

## 2024-04-10 DIAGNOSIS — M25561 Pain in right knee: Secondary | ICD-10-CM | POA: Diagnosis not present

## 2024-04-16 DIAGNOSIS — H0102A Squamous blepharitis right eye, upper and lower eyelids: Secondary | ICD-10-CM | POA: Diagnosis not present

## 2024-04-16 DIAGNOSIS — S0512XS Contusion of eyeball and orbital tissues, left eye, sequela: Secondary | ICD-10-CM | POA: Diagnosis not present

## 2024-04-16 DIAGNOSIS — H0102B Squamous blepharitis left eye, upper and lower eyelids: Secondary | ICD-10-CM | POA: Diagnosis not present

## 2024-04-16 DIAGNOSIS — H2513 Age-related nuclear cataract, bilateral: Secondary | ICD-10-CM | POA: Diagnosis not present

## 2024-04-17 ENCOUNTER — Ambulatory Visit: Admitting: Podiatry

## 2024-04-17 ENCOUNTER — Encounter: Payer: Self-pay | Admitting: Podiatry

## 2024-04-17 DIAGNOSIS — B351 Tinea unguium: Secondary | ICD-10-CM

## 2024-04-17 DIAGNOSIS — M79675 Pain in left toe(s): Secondary | ICD-10-CM | POA: Diagnosis not present

## 2024-04-17 DIAGNOSIS — L608 Other nail disorders: Secondary | ICD-10-CM

## 2024-04-17 DIAGNOSIS — M79674 Pain in right toe(s): Secondary | ICD-10-CM

## 2024-04-17 NOTE — Progress Notes (Signed)
 This patient presents to the office with chief complaint of long thick painful big toe nails.  Patient says the nails are painful walking and wearing shoes.  This patient is unable to self treat.  This patient is unable to trim his nails since he is unable to reach his nails.  he presents to the office for preventative foot care services.  General Appearance  Alert, conversant and in no acute stress.  Vascular  Dorsalis pedis and posterior tibial  pulses are palpable  bilaterally.  Capillary return is within normal limits  bilaterally. Temperature is within normal limits  bilaterally.  Neurologic  Senn-Weinstein monofilament wire test within normal limits  bilaterally. Muscle power within normal limits bilaterally.  Nails Thick disfigured discolored nails with subungual debris  hallux nails  bilaterally. No evidence of bacterial infection or drainage bilaterally.  Orthopedic  No limitations of motion  feet .  No crepitus or effusions noted.  No bony pathology or digital deformities noted.  Skin  normotropic skin with no porokeratosis noted bilaterally.  No signs of infections or ulcers noted.   Asymptomatic skin lesion dorsum of right foot.  Onychomycosis  Nails  B/L.  Pain in right toes  Pain in left toes  Debridement of nails both feet followed trimming the nails with dremel tool.    RTC 3 months.   Cordella Bold DPM

## 2024-06-26 ENCOUNTER — Ambulatory Visit

## 2024-06-26 ENCOUNTER — Ambulatory Visit (INDEPENDENT_AMBULATORY_CARE_PROVIDER_SITE_OTHER)

## 2024-06-26 DIAGNOSIS — G5762 Lesion of plantar nerve, left lower limb: Secondary | ICD-10-CM

## 2024-06-26 DIAGNOSIS — M722 Plantar fascial fibromatosis: Secondary | ICD-10-CM

## 2024-06-28 DIAGNOSIS — G5762 Lesion of plantar nerve, left lower limb: Secondary | ICD-10-CM | POA: Diagnosis not present

## 2024-06-28 MED ORDER — TRIAMCINOLONE ACETONIDE 10 MG/ML IJ SUSP
5.0000 mg | Freq: Once | INTRAMUSCULAR | Status: AC
  Administered 2024-06-28: 5 mg via INTRAMUSCULAR
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
## 2024-06-29 ENCOUNTER — Telehealth: Payer: Self-pay | Admitting: Neurology

## 2024-06-29 NOTE — Telephone Encounter (Signed)
 Request made for an earlier appointment

## 2024-06-30 ENCOUNTER — Ambulatory Visit: Admitting: Neurology

## 2024-06-30 ENCOUNTER — Encounter: Payer: Self-pay | Admitting: Neurology

## 2024-06-30 VITALS — BP 124/80 | HR 69 | Ht 75.0 in | Wt 356.5 lb

## 2024-06-30 DIAGNOSIS — Z87820 Personal history of traumatic brain injury: Secondary | ICD-10-CM

## 2024-06-30 DIAGNOSIS — M792 Neuralgia and neuritis, unspecified: Secondary | ICD-10-CM | POA: Insufficient documentation

## 2024-06-30 DIAGNOSIS — G40909 Epilepsy, unspecified, not intractable, without status epilepticus: Secondary | ICD-10-CM

## 2024-06-30 MED ORDER — LAMOTRIGINE 100 MG PO TABS: 100.0000 mg | ORAL_TABLET | Freq: Two times a day (BID) | ORAL | 11 refills | Status: AC

## 2024-06-30 MED ORDER — LAMOTRIGINE 25 MG PO TABS: ORAL_TABLET | ORAL | 0 refills | Status: AC

## 2024-06-30 NOTE — Patient Instructions (Signed)
 Meds ordered this encounter  Medications   lamoTRIgine  (LAMICTAL ) 25 MG tablet    Sig: 1 twice a day x1wk 2 twice a day x 2nd wk 3 twice a day x3rd wk    Dispense:  84 tablet    Refill:  0   lamoTRIgine  (LAMICTAL ) 100 MG tablet    Sig: Take 1 tablet (100 mg total) by mouth 2 (two) times daily.    Dispense:  60 tablet    Refill:  11    Fill in Feb 2025 after he finished Lamotrigine  25mg  titration     Week Lamotrigine  Keppra    1st week 25mg  twice a day 1000mg  twice a day  2nd week 25mg x2 tab twice aday 1000 mg twice a day  3rd week 25mg x3tab twice aday 1000mg  once every night  4th week 100mg  twice a day Start in Feb 2026,
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
## 2024-07-02 LAB — CBC WITH DIFFERENTIAL/PLATELET
Basophils Absolute: 0 x10E3/uL (ref 0.0–0.2)
Basos: 0 %
EOS (ABSOLUTE): 0.2 x10E3/uL (ref 0.0–0.4)
Eos: 3 %
Hematocrit: 51.2 % — ABNORMAL HIGH (ref 37.5–51.0)
Hemoglobin: 17.4 g/dL (ref 13.0–17.7)
Immature Grans (Abs): 0 x10E3/uL (ref 0.0–0.1)
Immature Granulocytes: 0 %
Lymphocytes Absolute: 2.1 x10E3/uL (ref 0.7–3.1)
Lymphs: 32 %
MCH: 33.3 pg — ABNORMAL HIGH (ref 26.6–33.0)
MCHC: 34 g/dL (ref 31.5–35.7)
MCV: 98 fL — ABNORMAL HIGH (ref 79–97)
Monocytes Absolute: 0.5 x10E3/uL (ref 0.1–0.9)
Monocytes: 7 %
Neutrophils Absolute: 4 x10E3/uL (ref 1.4–7.0)
Neutrophils: 58 %
Platelets: 232 x10E3/uL (ref 150–450)
RBC: 5.22 x10E6/uL (ref 4.14–5.80)
RDW: 12.8 % (ref 11.6–15.4)
WBC: 6.8 x10E3/uL (ref 3.4–10.8)

## 2024-07-02 LAB — COMPREHENSIVE METABOLIC PANEL WITH GFR
ALT: 35 IU/L (ref 0–44)
AST: 22 IU/L (ref 0–40)
Albumin: 4.4 g/dL (ref 3.8–4.9)
Alkaline Phosphatase: 49 IU/L (ref 47–123)
BUN/Creatinine Ratio: 17 (ref 10–24)
BUN: 18 mg/dL (ref 8–27)
Bilirubin Total: 0.5 mg/dL (ref 0.0–1.2)
CO2: 25 mmol/L (ref 20–29)
Calcium: 9.1 mg/dL (ref 8.6–10.2)
Chloride: 98 mmol/L (ref 96–106)
Creatinine, Ser: 1.07 mg/dL (ref 0.76–1.27)
Globulin, Total: 2.9 g/dL (ref 1.5–4.5)
Glucose: 121 mg/dL — ABNORMAL HIGH (ref 70–99)
Potassium: 4.3 mmol/L (ref 3.5–5.2)
Sodium: 141 mmol/L (ref 134–144)
Total Protein: 7.3 g/dL (ref 6.0–8.5)
eGFR: 79 mL/min/1.73

## 2024-07-02 LAB — MULTIPLE MYELOMA PANEL, SERUM
Albumin SerPl Elph-Mcnc: 4.1 g/dL (ref 2.9–4.4)
Albumin/Glob SerPl: 1.3 (ref 0.7–1.7)
Alpha 1: 0.1 g/dL (ref 0.0–0.4)
Alpha2 Glob SerPl Elph-Mcnc: 0.8 g/dL (ref 0.4–1.0)
B-Globulin SerPl Elph-Mcnc: 1.1 g/dL (ref 0.7–1.3)
Gamma Glob SerPl Elph-Mcnc: 1.2 g/dL (ref 0.4–1.8)
Globulin, Total: 3.2 g/dL (ref 2.2–3.9)
IgG (Immunoglobin G), Serum: 1359 mg/dL (ref 603–1613)
IgM (Immunoglobulin M), Srm: 123 mg/dL (ref 20–172)
Immunoglobulin A, (IgA) QN, Serum: 410 mg/dL — ABNORMAL HIGH (ref 90–386)

## 2024-07-02 LAB — CK: Total CK: 80 U/L (ref 41–331)

## 2024-07-02 LAB — TSH: TSH: 1.42 u[IU]/mL (ref 0.450–4.500)

## 2024-07-02 LAB — HGB A1C W/O EAG: Hgb A1c MFr Bld: 5.4 % (ref 4.8–5.6)

## 2024-07-02 LAB — C-REACTIVE PROTEIN: CRP: 3 mg/L (ref 0–10)

## 2024-07-02 LAB — SYPHILIS: RPR W/REFLEX TO RPR TITER AND TREPONEMAL ANTIBODIES, TRADITIONAL SCREENING AND DIAGNOSIS ALGORITHM: RPR Ser Ql: NONREACTIVE

## 2024-07-02 LAB — SEDIMENTATION RATE: Sed Rate: 18 mm/h (ref 0–30)

## 2024-07-02 LAB — VITAMIN B12: Vitamin B-12: 902 pg/mL (ref 232–1245)

## 2024-07-02 LAB — ANA W/REFLEX IF POSITIVE: Anti Nuclear Antibody (ANA): NEGATIVE

## 2024-07-03 ENCOUNTER — Telehealth: Payer: Self-pay

## 2024-07-03 NOTE — Telephone Encounter (Signed)
 Patient called in stating that he misplaced his right orthotics and would like to know how he could get a replacement.

## 2024-07-07 ENCOUNTER — Ambulatory Visit: Payer: Self-pay | Admitting: Neurology

## 2024-07-08 ENCOUNTER — Other Ambulatory Visit (HOSPITAL_COMMUNITY): Payer: Self-pay

## 2024-07-08 DIAGNOSIS — M5459 Other low back pain: Secondary | ICD-10-CM

## 2024-07-10 ENCOUNTER — Ambulatory Visit (HOSPITAL_COMMUNITY)

## 2024-07-16 ENCOUNTER — Institutional Professional Consult (permissible substitution): Admitting: Neurology

## 2024-07-17 ENCOUNTER — Other Ambulatory Visit: Payer: Self-pay | Admitting: Neurology

## 2024-07-20 ENCOUNTER — Ambulatory Visit (HOSPITAL_COMMUNITY)

## 2024-07-21 ENCOUNTER — Ambulatory Visit: Admitting: Podiatry

## 2024-07-24 ENCOUNTER — Ambulatory Visit (HOSPITAL_COMMUNITY): Admission: RE | Admit: 2024-07-24 | Discharge: 2024-07-24 | Disposition: A | Source: Ambulatory Visit

## 2024-07-24 DIAGNOSIS — M48061 Spinal stenosis, lumbar region without neurogenic claudication: Secondary | ICD-10-CM | POA: Diagnosis not present

## 2024-07-24 DIAGNOSIS — M51369 Other intervertebral disc degeneration, lumbar region without mention of lumbar back pain or lower extremity pain: Secondary | ICD-10-CM

## 2024-07-24 DIAGNOSIS — M47816 Spondylosis without myelopathy or radiculopathy, lumbar region: Secondary | ICD-10-CM

## 2024-07-24 DIAGNOSIS — M5459 Other low back pain: Secondary | ICD-10-CM | POA: Insufficient documentation

## 2024-08-04 ENCOUNTER — Ambulatory Visit: Admitting: Podiatry

## 2024-11-26 ENCOUNTER — Ambulatory Visit: Admitting: Neurology

## 2024-12-28 ENCOUNTER — Ambulatory Visit: Admitting: Neurology
